# Patient Record
Sex: Female | Born: 1945 | Race: Black or African American | Hispanic: No | Smoking: Never smoker
Health system: Southern US, Community
[De-identification: ages and names within clinical notes are randomized; demographics above are authoritative.]

## PROBLEM LIST (undated history)

## (undated) DIAGNOSIS — M109 Gout, unspecified: Secondary | ICD-10-CM

## (undated) DIAGNOSIS — R1110 Vomiting, unspecified: Secondary | ICD-10-CM

## (undated) DIAGNOSIS — H269 Unspecified cataract: Secondary | ICD-10-CM

## (undated) DIAGNOSIS — M1990 Unspecified osteoarthritis, unspecified site: Secondary | ICD-10-CM

## (undated) HISTORY — DX: Vomiting, unspecified: R11.10

## (undated) HISTORY — DX: Unspecified cataract: H26.9

## (undated) HISTORY — DX: Gout, unspecified: M10.9

## (undated) HISTORY — DX: Unspecified osteoarthritis, unspecified site: M19.90

## (undated) HISTORY — PX: EYE SURGERY: SHX253

---

## 1981-03-05 HISTORY — PX: TUBAL LIGATION: SHX77

## 2008-10-31 ENCOUNTER — Emergency Department (HOSPITAL_COMMUNITY): Admission: EM | Admit: 2008-10-31 | Discharge: 2008-10-31 | Payer: Self-pay | Admitting: Emergency Medicine

## 2010-06-10 LAB — POCT I-STAT, CHEM 8
BUN: 6 mg/dL (ref 6–23)
Chloride: 104 mEq/L (ref 96–112)
Sodium: 139 mEq/L (ref 135–145)
TCO2: 23 mmol/L (ref 0–100)

## 2010-06-10 LAB — DIFFERENTIAL
Basophils Absolute: 0 10*3/uL (ref 0.0–0.1)
Eosinophils Relative: 2 % (ref 0–5)
Lymphocytes Relative: 15 % (ref 12–46)
Neutro Abs: 7.7 10*3/uL (ref 1.7–7.7)

## 2010-06-10 LAB — CBC
Platelets: 214 10*3/uL (ref 150–400)
RDW: 14.3 % (ref 11.5–15.5)

## 2010-07-18 NOTE — Consult Note (Signed)
NAME:  JULIET, VASBINDER NO.:  [id number]   MEDICAL RECORD NO.:  [id number]          PATIENT TYPE:  EMS   LOCATION:  [facility]                         FACILITY:  [facility]   PHYSICIAN:  Georgia Lopes, M.D.  DATE OF BIRTH:  [birth year]   DATE OF CONSULTATION:  10/31/2008  DATE OF DISCHARGE:  10/31/2008                                 CONSULTATION   This is a 65 year old black female who presents to the ER with swelling  and pain of the right upper jaw.  Swelling began approximately 4 days  and progressively worsened.  She denies trismus, dysphagia, visual  changes, and fevers.   She has no known drug allergies.   Past medical history is noncontributory.   She takes no medications.   PHYSICAL EXAMINATION:  GENERAL:  A 65 year old black female, obese with  moderate facial swelling of the right cheek.  HEENT:  Normocephalic, atraumatic.  PERRLA.  EOMI.  Moderate swelling,  right cheek.  No orbital edema, orbital flush, or swelling, #2 buccal,  no trismus.  NECK:  Nontender.   CAT scan demonstrated PA lucency associated with tooth #2.   IMPRESSION:  A 65 year old black female with abscess.   PLAN:  IV antibiotics given in the ER, prescribed Cleocin 300 mg t.i.d.  for 7 days.  Follow up in my office, November 01, 2008, for incision and  drainage and extraction of tooth #2.      Georgia Lopes, M.D.  Electronically Signed     SMJ/MEDQ  D:  10/31/2008  T:  10/31/2008  Job:  [id number]

## 2012-10-17 ENCOUNTER — Encounter (HOSPITAL_COMMUNITY): Payer: Self-pay

## 2012-10-17 ENCOUNTER — Emergency Department (HOSPITAL_COMMUNITY): Payer: Medicare Other

## 2012-10-17 ENCOUNTER — Emergency Department (HOSPITAL_COMMUNITY)
Admission: EM | Admit: 2012-10-17 | Discharge: 2012-10-17 | Disposition: A | Discharge status: Home / Self Care | Payer: Medicare Other | Attending: Emergency Medicine | Admitting: Emergency Medicine

## 2012-10-17 DIAGNOSIS — L539 Erythematous condition, unspecified: Secondary | ICD-10-CM

## 2012-10-17 DIAGNOSIS — L988 Other specified disorders of the skin and subcutaneous tissue: Secondary | ICD-10-CM | POA: Diagnosis not present

## 2012-10-17 DIAGNOSIS — M109 Gout, unspecified: Secondary | ICD-10-CM | POA: Diagnosis not present

## 2012-10-17 DIAGNOSIS — M7989 Other specified soft tissue disorders: Secondary | ICD-10-CM | POA: Diagnosis not present

## 2012-10-17 DIAGNOSIS — R234 Changes in skin texture: Secondary | ICD-10-CM | POA: Insufficient documentation

## 2012-10-17 DIAGNOSIS — M79609 Pain in unspecified limb: Secondary | ICD-10-CM | POA: Diagnosis not present

## 2012-10-17 MED ORDER — DOXYCYCLINE HYCLATE 100 MG PO CAPS: 100.0000 mg | ORAL_CAPSULE | Freq: Two times a day (BID) | ORAL | Status: DC

## 2012-10-17 MED ORDER — HYDROCODONE-ACETAMINOPHEN 5-325 MG PO TABS
1.0000 | ORAL_TABLET | Freq: Once | ORAL | Status: AC
  Administered 2012-10-17: 1 via ORAL
  Filled 2012-10-17: qty 1

## 2012-10-17 MED ORDER — PREDNISONE 50 MG PO TABS: 50.0000 mg | ORAL_TABLET | Freq: Every day | ORAL | Status: DC

## 2012-10-17 MED ORDER — HYDROCODONE-ACETAMINOPHEN 5-325 MG PO TABS: 1.0000 | ORAL_TABLET | Freq: Four times a day (QID) | ORAL | Status: DC | PRN

## 2012-10-17 NOTE — ED Provider Notes (Signed)
Medical screening examination/treatment/procedure(s) were performed by non-physician practitioner and as supervising physician I was immediately available for consultation/collaboration.  Olivia Mackie, MD 10/17/12 [phone]

## 2012-10-17 NOTE — ED Notes (Signed)
Patient presents with c/o right foot pain and swelling x 2 days.  Denies any trauma or injury. Patient states that pain started "all of a sudden" and has progressively gotten worse. Pain woke her up out of sleep tonight. Rates pain 9/10; constant, throbbing and stingy. Pain radiates up leg to the knee. Endorses some N/T to toes. Denies any calf pain. Patient is able to ambulate but with pain. Patient took some Advil around 11 pm with no relief in pain.

## 2012-10-17 NOTE — ED Notes (Signed)
Patient transported to X-ray 

## 2012-10-17 NOTE — ED Notes (Signed)
PA at bedside.

## 2012-10-17 NOTE — ED Provider Notes (Signed)
CSN: [id number]     Arrival date & time 10/17/12  0447 History     First MD Initiated Contact with Patient 10/17/12 [phone]     Chief Complaint  Patient presents with  . Foot Pain   (Consider location/radiation/quality/duration/timing/severity/associated sxs/prior Treatment) HPI Patient presents emergency department with pain and swelling to the top of her right foot.  Patient, states, that pain started 2 days, ago.  Patient, states, that she took ibuprofen without relief of her pain.  Patient, states, that she does not have any numbness, weakness, nausea, vomiting, fever, back pain, or rash.  Patient, states, that she has a history of gout and this feels similar but not quite like her previous episodes. History reviewed. No pertinent past medical history. Past Surgical History  Procedure Laterality Date  . Tubal ligation  1983   History reviewed. No pertinent family history. History  Substance Use Topics  . Smoking status: Never Smoker   . Smokeless tobacco: Never Used  . Alcohol Use: No   OB History   Grav Para Term Preterm Abortions TAB SAB Ect Mult Living   7 7 7       7      Review of Systems All other systems negative except as documented in the HPI. All pertinent positives and negatives as reviewed in the HPI. Allergies  Review of patient's allergies indicates no known allergies.  Home Medications   Current Outpatient Rx  Name  Route  Sig  Dispense  Refill  . ibuprofen (ADVIL,MOTRIN) 200 MG tablet   Oral   Take 600 mg by mouth every 6 (six) hours as needed for pain.          BP 174/80  Pulse 79  Temp(Src) 98 F (36.7 C) (Oral)  Resp 16  SpO2 100% Physical Exam  Nursing note and vitals reviewed. Constitutional: She appears well-developed and well-nourished. No distress.  HENT:  Head: Normocephalic and atraumatic.  Mouth/Throat: Oropharynx is clear and moist.  Cardiovascular: Normal rate, regular rhythm and normal heart sounds.   Pulmonary/Chest: Effort  normal and breath sounds normal.  Musculoskeletal:       Feet:  Skin: Skin is warm and dry.    ED Course   Procedures (including critical care time)  Labs Reviewed - No data to display Dg Foot Complete Right  10/17/2012   *RADIOLOGY REPORT*  Clinical Data: Foot pain.  History of gout.  RIGHT FOOT COMPLETE - 3+ VIEW  Comparison: No priors.  Findings: In the medial and lateral aspect of the distal metatarsal and there are bony erosions adjacent to the joint space.  There may also be a small bony erosion at the medial base of the first metatarsal.  A large bony erosion is noted in the lateral base of the third metatarsal.  Prominence of soft tissues adjacent to the first MTP joint.  Degenerative changes of osteoarthritis are noted, most pronounced at the first MTP joint.  No definite acute displaced fracture, subluxation or dislocation.  IMPRESSION: 1.  Bony and soft tissue changes suggestive of gout, as above. 2.  Negative for acute fracture.   Original Report Authenticated By: Trudie Reed, M.D.   I explained to the patient that this could be gout versus cellulitis, but with her history of gout seems more likely that is the cause.  Patient be treated with medication for an acute gout flare, but also given a prescription of antibiotics if this is not helping in the next 3 days.  MDM  MDM  Reviewed: nursing note and vitals Interpretation: x-ray      Carlyle Dolly, PA-C 10/17/12 0700

## 2012-10-17 NOTE — ED Notes (Signed)
Patient back from x-ray 

## 2012-10-22 ENCOUNTER — Ambulatory Visit: Payer: Medicare Other | Attending: Internal Medicine | Admitting: Internal Medicine

## 2012-10-22 VITALS — BP 130/86 | HR 68 | Temp 98.7°F | Resp 16 | Wt 267.0 lb

## 2012-10-22 DIAGNOSIS — M109 Gout, unspecified: Secondary | ICD-10-CM

## 2012-10-22 LAB — CBC WITH DIFFERENTIAL/PLATELET
Eosinophils Relative: 0 % (ref 0–5)
Lymphocytes Relative: 11 % — ABNORMAL LOW (ref 12–46)
Lymphs Abs: 1.1 10*3/uL (ref 0.7–4.0)
MCV: 83.9 fL (ref 78.0–100.0)
Neutro Abs: 8.6 10*3/uL — ABNORMAL HIGH (ref 1.7–7.7)
Platelets: 264 10*3/uL (ref 150–400)
RBC: 4.67 MIL/uL (ref 3.87–5.11)
WBC: 9.9 10*3/uL (ref 4.0–10.5)

## 2012-10-22 MED ORDER — ALLOPURINOL 300 MG PO TABS: 300.0000 mg | ORAL_TABLET | Freq: Every day | ORAL | Status: DC

## 2012-10-22 MED ORDER — COLCHICINE 0.6 MG PO TABS: 0.6000 mg | ORAL_TABLET | Freq: Every day | ORAL | Status: DC

## 2012-10-22 NOTE — Patient Instructions (Signed)
Patient instructed to call tomorrow to obtain results of her labs

## 2012-10-22 NOTE — Progress Notes (Signed)
Patient ID: Tara Hernandez, female   DOB: 03-Mar-1946, 67 y.o.   MRN: [id number]    CC:  HPI: 67 year old female is here for followup of her ED visit on 8/15. The patient was in the ED for right foot swelling. She was prescribed prednisone and Vicodin for acute gout flare. The patient's symptoms have resolved and she feels 100% better.   No Known Allergies History reviewed. No pertinent past medical history. Current Outpatient Prescriptions on File Prior to Visit  Medication Sig Dispense Refill  . doxycycline (VIBRAMYCIN) 100 MG capsule Take 1 capsule (100 mg total) by mouth 2 (two) times daily.  20 capsule  0  . HYDROcodone-acetaminophen (NORCO/VICODIN) 5-325 MG per tablet Take 1 tablet by mouth every 6 (six) hours as needed for pain.  15 tablet  0   No current facility-administered medications on file prior to visit.   History reviewed. No pertinent family history. History   Social History  . Marital Status: Divorced    Spouse Name: N/A    Number of Children: N/A  . Years of Education: N/A   Occupational History  . Not on file.   Social History Main Topics  . Smoking status: Never Smoker   . Smokeless tobacco: Never Used  . Alcohol Use: No  . Drug Use: No  . Sexual Activity: Not Currently   Other Topics Concern  . Not on file   Social History Narrative  . No narrative on file    Review of Systems  Constitutional: Negative for fever, chills, diaphoresis, activity change, appetite change and fatigue.  HENT: Negative for ear pain, nosebleeds, congestion, facial swelling, rhinorrhea, neck pain, neck stiffness and ear discharge.   Eyes: Negative for pain, discharge, redness, itching and visual disturbance.  Respiratory: Negative for cough, choking, chest tightness, shortness of breath, wheezing and stridor.   Cardiovascular: Negative for chest pain, palpitations and leg swelling.  Gastrointestinal: Negative for abdominal distention.  Genitourinary: Negative for dysuria,  urgency, frequency, hematuria, flank pain, decreased urine volume, difficulty urinating and dyspareunia.  Musculoskeletal: Negative for back pain, joint swelling, arthralgias and gait problem.  Neurological: Negative for dizziness, tremors, seizures, syncope, facial asymmetry, speech difficulty, weakness, light-headedness, numbness and headaches.  Hematological: Negative for adenopathy. Does not bruise/bleed easily.  Psychiatric/Behavioral: Negative for hallucinations, behavioral problems, confusion, dysphoric mood, decreased concentration and agitation.    Objective:   Filed Vitals:   10/22/12 1651  BP: 130/86  Pulse: 68  Temp: 98.7 F (37.1 C)  Resp: 16    Physical Exam  Constitutional: Appears well-developed and well-nourished. No distress.  HENT: Normocephalic. External right and left ear normal. Oropharynx is clear and moist.  Eyes: Conjunctivae and EOM are normal. PERRLA, no scleral icterus.  Neck: Normal ROM. Neck supple. No JVD. No tracheal deviation. No thyromegaly.  CVS: RRR, S1/S2 +, no murmurs, no gallops, no carotid bruit.  Pulmonary: Effort and breath sounds normal, no stridor, rhonchi, wheezes, rales.  Abdominal: Soft. BS +,  no distension, tenderness, rebound or guarding.  Musculoskeletal: Normal range of motion. No edema and no tenderness.  Lymphadenopathy: No lymphadenopathy noted, cervical, inguinal. Neuro: Alert. Normal reflexes, muscle tone coordination. No cranial nerve deficit. Skin: Skin is warm and dry. No rash noted. Not diaphoretic. No erythema. No pallor.  Psychiatric: Normal mood and affect. Behavior, judgment, thought content normal.   Lab Results  Component Value Date   WBC 9.9 10/31/2008   HGB 12.9 10/31/2008   HCT 38.0 10/31/2008   MCV 87.1 10/31/2008  PLT 214 10/31/2008   Lab Results  Component Value Date   CREATININE 0.9 10/31/2008   BUN 6 10/31/2008   NA 139 10/31/2008   K 3.3* 10/31/2008   CL 104 10/31/2008    No results found for this  basename: HGBA1C   Lipid Panel  No results found for this basename: chol, trig, hdl, cholhdl, vldl, ldlcalc       Assessment and plan:   There are no active problems to display for this patient.      Acute gout flare We'll check uric acid level Will obtain BE met to establish her kidney function She has been prescribed allopurinol She is advised to call back tomorrow, to get the results of her blood work. If her kidney function is within the normal range and the patient can take colchicine as needed for acute gout She will need followup every 2 months I have discontinued her ibuprofen

## 2012-10-22 NOTE — Progress Notes (Signed)
Hospital follow up Recent flare up of her gout

## 2012-10-23 ENCOUNTER — Telehealth: Payer: Self-pay | Admitting: Internal Medicine

## 2012-10-23 LAB — LIPID PANEL
Cholesterol: 221 mg/dL — ABNORMAL HIGH (ref 0–200)
HDL: 65 mg/dL (ref >39)
Total CHOL/HDL Ratio: 3.4 Ratio
Triglycerides: 155 mg/dL — ABNORMAL HIGH (ref <150)
VLDL: 31 mg/dL (ref 0–40)

## 2012-10-23 LAB — COMPREHENSIVE METABOLIC PANEL
CO2: 30 mEq/L (ref 19–32)
Chloride: 100 mEq/L (ref 96–112)
Potassium: 4.2 mEq/L (ref 3.5–5.3)
Sodium: 139 mEq/L (ref 135–145)

## 2012-10-23 NOTE — Telephone Encounter (Signed)
Pt calling about lab results, please f/u with pt.

## 2012-10-28 ENCOUNTER — Telehealth: Payer: Self-pay | Admitting: Internal Medicine

## 2012-10-28 ENCOUNTER — Other Ambulatory Visit: Payer: Self-pay | Admitting: Internal Medicine

## 2012-10-28 MED ORDER — ERGOCALCIFEROL 1.25 MG (50000 UT) PO CAPS: 50000.0000 [IU] | ORAL_CAPSULE | ORAL | Status: DC

## 2012-10-28 NOTE — Telephone Encounter (Signed)
Pt has been experiencing some side effects from script allopurinol (ZYLOPRIM) 300 MG tablet; pt says muscle aching, swelling in knees, depression and dry mouth; pt discontinued medication on her own; pt would like a call back for advice on what to do next and results of bloodwork; pt number is [phone]

## 2012-10-28 NOTE — Telephone Encounter (Signed)
Spoke with patient about her reaction Per dr Hyman Hopes those symptoms would not be from allpurinal Patient states she is feeling better since she stopped taking the medicine

## 2012-10-28 NOTE — Telephone Encounter (Signed)
Informed patient that her uric acid was  Elevated and vitamin D low Dr Susie Cassette escribed  Prescription for vita D to her pharmacy and she already had a script For allpurinal

## 2012-11-24 ENCOUNTER — Ambulatory Visit: Payer: Medicare Other | Attending: Internal Medicine | Admitting: Internal Medicine

## 2012-11-24 ENCOUNTER — Encounter: Payer: Self-pay | Admitting: Internal Medicine

## 2012-11-24 VITALS — BP 171/94 | HR 78 | Temp 98.0°F | Resp 16 | Wt 265.0 lb

## 2012-11-24 DIAGNOSIS — I10 Essential (primary) hypertension: Secondary | ICD-10-CM | POA: Diagnosis not present

## 2012-11-24 DIAGNOSIS — E785 Hyperlipidemia, unspecified: Secondary | ICD-10-CM | POA: Diagnosis not present

## 2012-11-24 DIAGNOSIS — Z09 Encounter for follow-up examination after completed treatment for conditions other than malignant neoplasm: Secondary | ICD-10-CM | POA: Insufficient documentation

## 2012-11-24 MED ORDER — COLCHICINE 0.6 MG PO TABS: 0.6000 mg | ORAL_TABLET | Freq: Every day | ORAL | Status: DC

## 2012-11-24 NOTE — Patient Instructions (Signed)
How to Take Your Blood Pressure  These instructions are only for electronic home blood pressure machines. You will need:   An automatic or semi-automatic blood pressure machine.  Fresh batteries for the blood pressure machine. HOW DO I USE THESE TOOLS TO CHECK MY BLOOD PRESSURE?   There are 2 numbers that make up your blood pressure. For example: 120/80.  The first number (120 in our example) is called the "systolic pressure." It is a measure of the pressure in your blood vessels when your heart is pumping blood.  The second number (80 in our example) is called the "diastolic pressure." It is a measure of the pressure in your blood vessels when your heart is resting between beats.  Before you buy a home blood pressure machine, check the size of your arm so you can buy the right size cuff. Here is how to check the size of your arm:  Use a tape measure that shows both inches and centimeters.  Wrap the tape measure around the middle upper part of your arm. You may need someone to help you measure right.  Write down your arm measurement in both inches and centimeters.  To measure your blood pressure right, it is important to have the right size cuff.  If your arm is up to 13 inches (37 to 34 centimeters), get an adult cuff size.  If your arm is 13 to 17 inches (35 to 44 centimeters), get a large adult cuff size.  If your arm is 17 to 20 inches (45 to 52 centimeters), get an adult thigh cuff.  Try to rest or relax for at least 30 minutes before you check your blood pressure.  Do not smoke.  Do not have any drinks with caffeine, such as:  Pop.  Coffee.  Tea.  Check your blood pressure in a quiet room.  Sit down and stretch out your arm on a table. Keep your arm at about the level of your heart. Let your arm relax. GETTING BLOOD PRESSURE READINGS  Make sure you remove any tight-fighting clothing from your arm. Wrap the cuff around your upper arm. Wrap it just above the bend,  and above where you felt the pulse. You should be able to slip a finger between the cuff and your arm. If you cannot slip a finger in the cuff, it is too tight and should be removed and rewrapped.  Some units requires you to manually pump up the arm cuff.  Automatic units inflate the cuff when you press a button.  Cuff deflation is automatic in both models.  After the cuff is inflated, the unit measures your blood pressure and pulse. The readings are displayed on a monitor. Hold still and breathe normally while the cuff is inflated.  Getting a reading takes less than a minute.  Some models store readings in a memory. Some provide a printout of readings.  Get readings at different times of the day. You should wait at least 5 minutes between readings. Take readings with you to your next doctor's visit. Document Released: 02/02/2008 Document Revised: 05/14/2011 Document Reviewed: 02/02/2008 [employer] Patient Information 2014 [employer].  

## 2012-11-24 NOTE — Progress Notes (Signed)
Patient ID: Tara Hernandez, female   DOB: 1945-06-01, 67 y.o.   MRN: [id number]   CC: Followup  HPI: Patient is 67 year old pleasant female who presents to clinic for followup. She reports doing well, denies chest pain or shortness of breath, no significant medical problems, exercises regularly and checks blood pressure regularly.  No Known Allergies Past Medical History  Diagnosis Date  . Gout    Current Outpatient Prescriptions on File Prior to Visit  Medication Sig Dispense Refill  . allopurinol (ZYLOPRIM) 300 MG tablet Take 1 tablet (300 mg total) by mouth daily.  60 tablet  6  . doxycycline (VIBRAMYCIN) 100 MG capsule Take 1 capsule (100 mg total) by mouth 2 (two) times daily.  20 capsule  0  . ergocalciferol (VITAMIN D2) 50000 UNITS capsule Take 1 capsule (50,000 Units total) by mouth once a week.  10 capsule  12  . HYDROcodone-acetaminophen (NORCO/VICODIN) 5-325 MG per tablet Take 1 tablet by mouth every 6 (six) hours as needed for pain.  15 tablet  0   No current facility-administered medications on file prior to visit.   No known family medical history History   Social History  . Marital Status: Divorced    Spouse Name: N/A    Number of Children: N/A  . Years of Education: N/A   Occupational History  . Not on file.   Social History Main Topics  . Smoking status: Never Smoker   . Smokeless tobacco: Never Used  . Alcohol Use: No  . Drug Use: No  . Sexual Activity: Not Currently   Other Topics Concern  . Not on file   Social History Narrative  . No narrative on file    Review of Systems  Constitutional: Negative for fever, chills, diaphoresis, activity change, appetite change and fatigue.  HENT: Negative for ear pain, nosebleeds, congestion, facial swelling, rhinorrhea, neck pain, neck stiffness and ear discharge.   Eyes: Negative for pain, discharge, redness, itching and visual disturbance.  Respiratory: Negative for cough, choking, chest tightness, shortness of  breath, wheezing and stridor.   Cardiovascular: Negative for chest pain, palpitations and leg swelling.  Gastrointestinal: Negative for abdominal distention.  Genitourinary: Negative for dysuria, urgency, frequency, hematuria, flank pain, decreased urine volume, difficulty urinating and dyspareunia.  Musculoskeletal: Negative for back pain, joint swelling, arthralgias and gait problem.  Neurological: Negative for dizziness, tremors, seizures, syncope, facial asymmetry, speech difficulty, weakness, light-headedness, numbness and headaches.  Hematological: Negative for adenopathy. Does not bruise/bleed easily.  Psychiatric/Behavioral: Negative for hallucinations, behavioral problems, confusion, dysphoric mood, decreased concentration and agitation.    Objective:   Filed Vitals:   11/24/12 1247  BP: 171/94  Pulse: 78  Temp: 98 F (36.7 C)  Resp: 16    Physical Exam  Constitutional: Appears well-developed and well-nourished. No distress.  CVS: RRR, S1/S2 +, no murmurs, no gallops, no carotid bruit.  Pulmonary: Effort and breath sounds normal, no stridor, rhonchi, wheezes, rales.  Abdominal: Soft. BS +,  no distension, tenderness, rebound or guarding.    Lab Results  Component Value Date   WBC 9.9 10/22/2012   HGB 13.3 10/22/2012   HCT 39.2 10/22/2012   MCV 83.9 10/22/2012   PLT 264 10/22/2012   Lab Results  Component Value Date   CREATININE 0.83 10/22/2012   BUN 13 10/22/2012   NA 139 10/22/2012   K 4.2 10/22/2012   CL 100 10/22/2012   CO2 30 10/22/2012    No results found for this basename: HGBA1C  Lipid Panel     Component Value Date/Time   CHOL 221* 10/22/2012 1732   TRIG 155* 10/22/2012 1732   HDL 65 10/22/2012 1732   CHOLHDL 3.4 10/22/2012 1732   VLDL 31 10/22/2012 1732   LDLCALC 125* 10/22/2012 1732       Assessment and plan:   Hypertension - we have discussed target blood pressure range and I have advised patient to check blood pressure regularly and to call us back  if the numbers are persistently higher than 140/90, patient does not want to start blood pressure medicine at this time and I have encouraged her to monitor blood pressure closely for the weekend provide appropriate management.  Hyperlipidemia - we have discussed results of the cholesterol panel and target LDL and cholesterol. Patient verbalized understanding, she will remain compliant with her exercise and dietary restrictions. We'll check cholesterol in 6 months and will decide on the management at that point.

## 2012-11-24 NOTE — Progress Notes (Signed)
Pt is here for a f/u and to review meds Reports she is having s/e to allopurinol and has stopped it She is alert w/no signs of acute distress.

## 2012-11-25 ENCOUNTER — Ambulatory Visit: Payer: Medicaid Other

## 2013-08-13 DIAGNOSIS — H40039 Anatomical narrow angle, unspecified eye: Secondary | ICD-10-CM | POA: Diagnosis not present

## 2013-08-13 DIAGNOSIS — H2510 Age-related nuclear cataract, unspecified eye: Secondary | ICD-10-CM | POA: Diagnosis not present

## 2013-08-31 DIAGNOSIS — H2589 Other age-related cataract: Secondary | ICD-10-CM | POA: Diagnosis not present

## 2013-09-07 DIAGNOSIS — Z961 Presence of intraocular lens: Secondary | ICD-10-CM | POA: Diagnosis not present

## 2013-09-07 DIAGNOSIS — H269 Unspecified cataract: Secondary | ICD-10-CM | POA: Diagnosis not present

## 2013-09-07 DIAGNOSIS — H2589 Other age-related cataract: Secondary | ICD-10-CM | POA: Diagnosis not present

## 2013-09-07 DIAGNOSIS — H2510 Age-related nuclear cataract, unspecified eye: Secondary | ICD-10-CM | POA: Diagnosis not present

## 2013-10-19 ENCOUNTER — Encounter: Payer: Medicare Other | Admitting: Internal Medicine

## 2013-10-19 DIAGNOSIS — Z961 Presence of intraocular lens: Secondary | ICD-10-CM | POA: Diagnosis not present

## 2013-10-19 DIAGNOSIS — H269 Unspecified cataract: Secondary | ICD-10-CM | POA: Diagnosis not present

## 2013-10-19 DIAGNOSIS — H2510 Age-related nuclear cataract, unspecified eye: Secondary | ICD-10-CM | POA: Diagnosis not present

## 2013-10-19 DIAGNOSIS — H2589 Other age-related cataract: Secondary | ICD-10-CM | POA: Diagnosis not present

## 2013-11-14 DIAGNOSIS — H1045 Other chronic allergic conjunctivitis: Secondary | ICD-10-CM | POA: Diagnosis not present

## 2014-01-04 ENCOUNTER — Encounter: Payer: Self-pay | Admitting: Internal Medicine

## 2014-03-08 ENCOUNTER — Ambulatory Visit (INDEPENDENT_AMBULATORY_CARE_PROVIDER_SITE_OTHER): Payer: Medicare Other | Admitting: Emergency Medicine

## 2014-03-08 ENCOUNTER — Ambulatory Visit (INDEPENDENT_AMBULATORY_CARE_PROVIDER_SITE_OTHER): Payer: Medicare Other

## 2014-03-08 VITALS — BP 126/82 | HR 89 | Temp 98.0°F | Resp 16 | Ht 64.5 in | Wt 250.0 lb

## 2014-03-08 DIAGNOSIS — M25561 Pain in right knee: Secondary | ICD-10-CM

## 2014-03-08 DIAGNOSIS — M1711 Unilateral primary osteoarthritis, right knee: Secondary | ICD-10-CM

## 2014-03-08 MED ORDER — DICLOFENAC SODIUM 1 % TD GEL: TRANSDERMAL | Status: DC

## 2014-03-08 NOTE — Progress Notes (Signed)
Subjective:  This chart was scribed for Tara Jordan, MD by Tara Hernandez, ED Scribe at [facility].The patient was seen in exam room 05 and the patient's care was started at 1:15 PM.   Patient ID: Tara Hernandez, female    DOB: 11-22-1945, 69 y.o.   MRN: [id number] Chief Complaint  Patient presents with   Knee Pain    right knee pain; on/off over 1 year; pain radiates up the thigh and pain; sometimes can not sleep   HPI HPI Comments: Tara Hernandez is a 69 y.o. female who presents to [facility] complaining of intermittent right knee pain, ongoing for 1 year. Her knee "pops a lot" but has never gotten stuck. No recent flare ups. She has trouble sleeping due to her knee pain. Pt is only taking OTC medication for relief.  Pt reports falling years ago and she believes this is the source. Pt has trouble with exercising but she does not believe she injured her knee due to exercise recently. Pt is retired, she was a Probation officer.  There are no active problems to display for this patient.  Past Medical History  Diagnosis Date   Gout    Past Surgical History  Procedure Laterality Date   Tubal ligation  1983   Eye surgery     No Known Allergies Prior to Admission medications   Not on File   History   Social History   Marital Status: Divorced    Spouse Name: N/A    Number of Children: N/A   Years of Education: N/A   Occupational History   Not on file.   Social History Main Topics   Smoking status: Never Smoker    Smokeless tobacco: Never Used   Alcohol Use: No   Drug Use: No   Sexual Activity: Not Currently   Other Topics Concern   Not on file   Social History Narrative   Review of Systems  Musculoskeletal: Positive for arthralgias.  Psychiatric/Behavioral: Positive for sleep disturbance.       Objective:  BP 126/82 mmHg   Pulse 89   Temp(Src) 98 F (36.7 C) (Oral)   Resp 16   Ht 5' 4.5" (1.638 m)   Wt 250 lb (113.399 kg)   BMI 42.27 kg/m2    SpO2 98%  Physical Exam  Constitutional: She is oriented to person, place, and time. She appears well-developed and well-nourished. No distress.  HENT:  Head: Normocephalic and atraumatic.  Eyes: EOM are normal.  Neck: Normal range of motion.  Cardiovascular: Normal rate.   Pulmonary/Chest: Effort normal.  Musculoskeletal:  Degenerative changes around the right knee Pain with flexion and extension. No joint instability, or redness and increased warmth.  Neurological: She is alert and oriented to person, place, and time. No cranial nerve deficit. She exhibits normal muscle tone. Coordination normal.  Skin: Skin is warm and dry.  Psychiatric: She has a normal mood and affect. Her behavior is normal.  Nursing note and vitals reviewed. [facility] reading (PRIMARY) by  Dr. Everlene Farrier patient has arthritic changes around the kneecap with significant narrowing laterally no fracture seen.       Assessment & Plan:   Patient has significant osteoarthritis of the right knee. Referral made to orthopedics to help with this. She is placed on Voltaren gel to apply 3 times a day.I personally performed the services described in this documentation, which was scribed in my presence. The recorded information has been reviewed and is accurate.

## 2014-03-08 NOTE — Progress Notes (Signed)
Subjective:  This chart was scribed for Nena Jordan, MD by Dellis Filbert, ED Scribe at [facility].The patient was seen in exam room 05 and the patient's care was started at 1:15 PM.   Patient ID: Tara Hernandez, female    DOB: 1945/04/03, 69 y.o.   MRN: [id number] Chief Complaint  Patient presents with  . Knee Pain    right knee pain; on/off over 1 year; pain radiates up the thigh and pain; sometimes can not sleep   Knee Pain    HPI Comments: Tara Hernandez is a 69 y.o. female who presents to [facility] complaining of intermittent right knee pain, ongoing for 1 year. Her knee "pops a lot" but has never gotten stuck. No recent flare ups. She has trouble sleeping due to her knee pain. Pt is only taking OTC medication for relief.  Pt reports falling years ago and she believes this is the source. Pt has trouble with exercising but she does not believe she injured her knee due to exercise recently. Pt is retired, she was a Probation officer.  There are no active problems to display for this patient.  Past Medical History  Diagnosis Date  . Gout    Past Surgical History  Procedure Laterality Date  . Tubal ligation  1983  . Eye surgery     No Known Allergies Prior to Admission medications   Not on File   History   Social History  . Marital Status: Divorced    Spouse Name: N/A    Number of Children: N/A  . Years of Education: N/A   Occupational History  . Not on file.   Social History Main Topics  . Smoking status: Never Smoker   . Smokeless tobacco: Never Used  . Alcohol Use: No  . Drug Use: No  . Sexual Activity: Not Currently   Other Topics Concern  . Not on file   Social History Narrative   Review of Systems  Musculoskeletal: Positive for arthralgias.  Psychiatric/Behavioral: Positive for sleep disturbance.       Objective:  BP 126/82 mmHg  Pulse 89  Temp(Src) 98 F (36.7 C) (Oral)  Resp 16  Ht 5' 4.5" (1.638 m)  Wt 250 lb (113.399 kg)  BMI  42.27 kg/m2  SpO2 98%  Physical Exam  Constitutional: She is oriented to person, place, and time. She appears well-developed and well-nourished. No distress.  HENT:  Head: Normocephalic and atraumatic.  Eyes: EOM are normal.  Neck: Normal range of motion.  Cardiovascular: Normal rate.   Pulmonary/Chest: Effort normal.  Musculoskeletal:  Degenerative changes around the right knee Pain with flexion and extension. No joint instability, or redness and increased warmth.  Neurological: She is alert and oriented to person, place, and time. No cranial nerve deficit. She exhibits normal muscle tone. Coordination normal.  Skin: Skin is warm and dry.  Psychiatric: She has a normal mood and affect. Her behavior is normal.  Nursing note and vitals reviewed. [facility] reading (PRIMARY) by  Dr. Everlene Farrier patient has arthritic changes around the kneecap with significant narrowing laterally no fracture seen.       Assessment & Plan:   Patient has significant osteoarthritis of the right knee. Referral made to orthopedics to help with this. She is placed on Voltaren gel to apply 3 times a day.I personally performed the services described in this documentation, which was scribed in my presence. The recorded information has been reviewed and is accurate.

## 2014-03-08 NOTE — Patient Instructions (Signed)

## 2014-03-25 ENCOUNTER — Encounter: Payer: Self-pay | Admitting: Internal Medicine

## 2014-03-25 ENCOUNTER — Ambulatory Visit: Payer: Medicare Other | Attending: Internal Medicine | Admitting: Internal Medicine

## 2014-03-25 VITALS — BP 151/83 | HR 87 | Temp 98.0°F | Resp 16 | Wt 251.0 lb

## 2014-03-25 DIAGNOSIS — M179 Osteoarthritis of knee, unspecified: Secondary | ICD-10-CM | POA: Insufficient documentation

## 2014-03-25 DIAGNOSIS — M1711 Unilateral primary osteoarthritis, right knee: Secondary | ICD-10-CM

## 2014-03-25 DIAGNOSIS — I10 Essential (primary) hypertension: Secondary | ICD-10-CM | POA: Insufficient documentation

## 2014-03-25 DIAGNOSIS — M109 Gout, unspecified: Secondary | ICD-10-CM | POA: Insufficient documentation

## 2014-03-25 DIAGNOSIS — Z1231 Encounter for screening mammogram for malignant neoplasm of breast: Secondary | ICD-10-CM | POA: Diagnosis not present

## 2014-03-25 DIAGNOSIS — E559 Vitamin D deficiency, unspecified: Secondary | ICD-10-CM | POA: Diagnosis not present

## 2014-03-25 DIAGNOSIS — R7309 Other abnormal glucose: Secondary | ICD-10-CM | POA: Insufficient documentation

## 2014-03-25 DIAGNOSIS — R7303 Prediabetes: Secondary | ICD-10-CM | POA: Insufficient documentation

## 2014-03-25 LAB — COMPLETE METABOLIC PANEL WITH GFR
ALBUMIN: 4.1 g/dL (ref 3.5–5.2)
ALT: 17 U/L (ref 0–35)
AST: 22 U/L (ref 0–37)
Alkaline Phosphatase: 68 U/L (ref 39–117)
BUN: 14 mg/dL (ref 6–23)
CALCIUM: 9.2 mg/dL (ref 8.4–10.5)
CO2: 25 mEq/L (ref 19–32)
Chloride: 104 mEq/L (ref 96–112)
Creat: 0.7 mg/dL (ref 0.50–1.10)
GFR, Est Non African American: 89 mL/min
Glucose, Bld: 88 mg/dL (ref 70–99)
Potassium: 4.7 mEq/L (ref 3.5–5.3)
SODIUM: 139 meq/L (ref 135–145)
Total Bilirubin: 0.7 mg/dL (ref 0.2–1.2)
Total Protein: 7.2 g/dL (ref 6.0–8.3)

## 2014-03-25 LAB — LIPID PANEL
Cholesterol: 198 mg/dL (ref 0–200)
HDL: 55 mg/dL (ref >39)
LDL Cholesterol: 128 mg/dL — ABNORMAL HIGH (ref 0–99)
Total CHOL/HDL Ratio: 3.6 Ratio
Triglycerides: 77 mg/dL (ref <150)
VLDL: 15 mg/dL (ref 0–40)

## 2014-03-25 LAB — POCT GLYCOSYLATED HEMOGLOBIN (HGB A1C): Hemoglobin A1C: 5.3

## 2014-03-25 MED ORDER — ACETAMINOPHEN-CODEINE #3 300-30 MG PO TABS: 1.0000 | ORAL_TABLET | Freq: Three times a day (TID) | ORAL | Status: DC | PRN

## 2014-03-25 NOTE — Patient Instructions (Signed)
DASH Eating Plan DASH stands for "Dietary Approaches to Stop Hypertension." The DASH eating plan is a healthy eating plan that has been shown to reduce high blood pressure (hypertension). Additional health benefits may include reducing the risk of type 2 diabetes mellitus, heart disease, and stroke. The DASH eating plan may also help with weight loss. WHAT DO I NEED TO KNOW ABOUT THE DASH EATING PLAN? For the DASH eating plan, you will follow these general guidelines:  Choose foods with a percent daily value for sodium of less than 5% (as listed on the food label).  Use salt-free seasonings or herbs instead of table salt or sea salt.  Check with your health care provider or pharmacist before using salt substitutes.  Eat lower-sodium products, often labeled as "lower sodium" or "no salt added."  Eat fresh foods.  Eat more vegetables, fruits, and low-fat dairy products.  Choose whole grains. Look for the word "whole" as the first word in the ingredient list.  Choose fish and skinless chicken or turkey more often than red meat. Limit fish, poultry, and meat to 6 oz (170 g) each day.  Limit sweets, desserts, sugars, and sugary drinks.  Choose heart-healthy fats.  Limit cheese to 1 oz (28 g) per day.  Eat more home-cooked food and less restaurant, buffet, and fast food.  Limit fried foods.  Cook foods using methods other than frying.  Limit canned vegetables. If you do use them, rinse them well to decrease the sodium.  When eating at a restaurant, ask that your food be prepared with less salt, or no salt if possible. WHAT FOODS CAN I EAT? Seek help from a dietitian for individual calorie needs. Grains Whole grain or whole wheat bread. Brown rice. Whole grain or whole wheat pasta. Quinoa, bulgur, and whole grain cereals. Low-sodium cereals. Corn or whole wheat flour tortillas. Whole grain cornbread. Whole grain crackers. Low-sodium crackers. Vegetables Fresh or frozen vegetables  (raw, steamed, roasted, or grilled). Low-sodium or reduced-sodium tomato and vegetable juices. Low-sodium or reduced-sodium tomato sauce and paste. Low-sodium or reduced-sodium canned vegetables.  Fruits All fresh, canned (in natural juice), or frozen fruits. Meat and Other Protein Products Ground beef (85% or leaner), grass-fed beef, or beef trimmed of fat. Skinless chicken or turkey. Ground chicken or turkey. Pork trimmed of fat. All fish and seafood. Eggs. Dried beans, peas, or lentils. Unsalted nuts and seeds. Unsalted canned beans. Dairy Low-fat dairy products, such as skim or 1% milk, 2% or reduced-fat cheeses, low-fat ricotta or cottage cheese, or plain low-fat yogurt. Low-sodium or reduced-sodium cheeses. Fats and Oils Tub margarines without trans fats. Light or reduced-fat mayonnaise and salad dressings (reduced sodium). Avocado. Safflower, olive, or canola oils. Natural peanut or almond butter. Other Unsalted popcorn and pretzels. The items listed above may not be a complete list of recommended foods or beverages. Contact your dietitian for more options. WHAT FOODS ARE NOT RECOMMENDED? Grains White bread. White pasta. White rice. Refined cornbread. Bagels and croissants. Crackers that contain trans fat. Vegetables Creamed or fried vegetables. Vegetables in a cheese sauce. Regular canned vegetables. Regular canned tomato sauce and paste. Regular tomato and vegetable juices. Fruits Dried fruits. Canned fruit in light or heavy syrup. Fruit juice. Meat and Other Protein Products Fatty cuts of meat. Ribs, chicken wings, bacon, sausage, bologna, salami, chitterlings, fatback, hot dogs, bratwurst, and packaged luncheon meats. Salted nuts and seeds. Canned beans with salt. Dairy Whole or 2% milk, cream, half-and-half, and cream cheese. Whole-fat or sweetened yogurt. Full-fat   cheeses or blue cheese. Nondairy creamers and whipped toppings. Processed cheese, cheese spreads, or cheese  curds. Condiments Onion and garlic salt, seasoned salt, table salt, and sea salt. Canned and packaged gravies. Worcestershire sauce. Tartar sauce. Barbecue sauce. Teriyaki sauce. Soy sauce, including reduced sodium. Steak sauce. Fish sauce. Oyster sauce. Cocktail sauce. Horseradish. Ketchup and mustard. Meat flavorings and tenderizers. Bouillon cubes. Hot sauce. Tabasco sauce. Marinades. Taco seasonings. Relishes. Fats and Oils Butter, stick margarine, lard, shortening, ghee, and bacon fat. Coconut, palm kernel, or palm oils. Regular salad dressings. Other Pickles and olives. Salted popcorn and pretzels. The items listed above may not be a complete list of foods and beverages to avoid. Contact your dietitian for more information. WHERE CAN I FIND MORE INFORMATION? National Heart, Lung, and Blood Institute: www.nhlbi.nih.gov/health/health-topics/topics/dash/ Document Released: 02/08/2011 Document Revised: 07/06/2013 Document Reviewed: 12/24/2012 ExitCare Patient Information 2015 [employer]. This information is not intended to replace advice given to you by your health care provider. Make sure you discuss any questions you have with your health care provider. Hypertension Hypertension, commonly called high blood pressure, is when the force of blood pumping through your arteries is too strong. Your arteries are the blood vessels that carry blood from your heart throughout your body. A blood pressure reading consists of a higher number over a lower number, such as 110/72. The higher number (systolic) is the pressure inside your arteries when your heart pumps. The lower number (diastolic) is the pressure inside your arteries when your heart relaxes. Ideally you want your blood pressure below 120/80. Hypertension forces your heart to work harder to pump blood. Your arteries may become narrow or stiff. Having hypertension puts you at risk for heart disease, stroke, and other problems.  RISK  FACTORS Some risk factors for high blood pressure are controllable. Others are not.  Risk factors you cannot control include:   Race. You may be at higher risk if you are African American.  Age. Risk increases with age.  Gender. Men are at higher risk than women before age 45 years. After age 65, women are at higher risk than men. Risk factors you can control include:  Not getting enough exercise or physical activity.  Being overweight.  Getting too much fat, sugar, calories, or salt in your diet.  Drinking too much alcohol. SIGNS AND SYMPTOMS Hypertension does not usually cause signs or symptoms. Extremely high blood pressure (hypertensive crisis) may cause headache, anxiety, shortness of breath, and nosebleed. DIAGNOSIS  To check if you have hypertension, your health care provider will measure your blood pressure while you are seated, with your arm held at the level of your heart. It should be measured at least twice using the same arm. Certain conditions can cause a difference in blood pressure between your right and left arms. A blood pressure reading that is higher than normal on one occasion does not mean that you need treatment. If one blood pressure reading is high, ask your health care provider about having it checked again. TREATMENT  Treating high blood pressure includes making lifestyle changes and possibly taking medicine. Living a healthy lifestyle can help lower high blood pressure. You may need to change some of your habits. Lifestyle changes may include:  Following the DASH diet. This diet is high in fruits, vegetables, and whole grains. It is low in salt, red meat, and added sugars.  Getting at least 2 hours of brisk physical activity every week.  Losing weight if necessary.  Not smoking.  Limiting   alcoholic beverages.  Learning ways to reduce stress. If lifestyle changes are not enough to get your blood pressure under control, your health care provider may  prescribe medicine. You may need to take more than one. Work closely with your health care provider to understand the risks and benefits. HOME CARE INSTRUCTIONS  Have your blood pressure rechecked as directed by your health care provider.   Take medicines only as directed by your health care provider. Follow the directions carefully. Blood pressure medicines must be taken as prescribed. The medicine does not work as well when you skip doses. Skipping doses also puts you at risk for problems.   Do not smoke.   Monitor your blood pressure at home as directed by your health care provider. SEEK MEDICAL CARE IF:   You think you are having a reaction to medicines taken.  You have recurrent headaches or feel dizzy.  You have swelling in your ankles.  You have trouble with your vision. SEEK IMMEDIATE MEDICAL CARE IF:  You develop a severe headache or confusion.  You have unusual weakness, numbness, or feel faint.  You have severe chest or abdominal pain.  You vomit repeatedly.  You have trouble breathing. MAKE SURE YOU:   Understand these instructions.  Will watch your condition.  Will get help right away if you are not doing well or get worse. Document Released: 02/19/2005 Document Revised: 07/06/2013 Document Reviewed: 12/12/2012 ExitCare Patient Information 2015 ExitCare, LLC. This information is not intended to replace advice given to you by your health care provider. Make sure you discuss any questions you have with your health care provider.  

## 2014-03-25 NOTE — Progress Notes (Signed)
Patient states she is here for her annual physical Currently not taking any prescribed medications Patient has declined the flu vaccine as well

## 2014-03-25 NOTE — Progress Notes (Signed)
Patient ID: Tara Hernandez, female   DOB: 1945/08/14, 69 y.o.   MRN: [id number]   Tara Hernandez, is a 69 y.o. female  [id number]  [id number]  DOB - [birth year]  Chief Complaint  Patient presents with  . Annual Exam        Subjective:   Tara Hernandez is a 69 y.o. female here today for a follow up visit. She is a pleasant woman with a past history of right knee osteoarthritis, recent eye surgery for cataracts, currently not on any medication, here today for her annual physical examination. She had a history of hypovitaminosis D that was replaced, she would like to know what her level is as of today. She has not had colonoscopy, no mammogram in the recent past. She has no complaint today. Patient has No headache, No chest pain, No abdominal pain - No Nausea, No new weakness tingling or numbness, No Cough - SOB.  Problem  Vitamin D Deficiency  Encounter for Screening Mammogram for Breast Cancer  Prediabetes  Essential Hypertension  Primary Osteoarthritis of Right Knee    ALLERGIES: No Known Allergies  PAST MEDICAL HISTORY: Past Medical History  Diagnosis Date  . Gout     MEDICATIONS AT HOME: Prior to Admission medications   Medication Sig Start Date End Date Taking? Authorizing Provider  acetaminophen-codeine (TYLENOL #3) 300-30 MG per tablet Take 1 tablet by mouth every 8 (eight) hours as needed for moderate pain. 03/25/14   Tresa Garter, MD  diclofenac sodium (VOLTAREN) 1 % GEL Apply topically to the right knee 3 times a day. 03/08/14   Darlyne Russian, MD     Objective:   Filed Vitals:   03/25/14 1457  BP: 151/83  Pulse: 87  Temp: 98 F (36.7 C)  Resp: 16  Weight: 251 lb (113.853 kg)  SpO2: 100%    Exam General appearance : Awake, alert, not in any distress. Speech Clear. Not toxic looking HEENT: Atraumatic and Normocephalic, pupils equally reactive to light and accomodation Neck: supple, no JVD. No cervical lymphadenopathy.  Chest:Good air entry  bilaterally, no added sounds  CVS: S1 S2 regular, no murmurs.  Abdomen: Bowel sounds present, Non tender and not distended with no gaurding, rigidity or rebound. Extremities: B/L Lower Ext shows no edema, both legs are warm to touch Neurology: Awake alert, and oriented X 3, CN II-XII intact, Non focal Skin:No Rash Wounds:N/A  Data Review No results found for: HGBA1C   Assessment & Plan   1. Essential hypertension  - CBC with Differential - COMPLETE METABOLIC PANEL WITH GFR - Lipid panel - TSH - Urinalysis, Complete  2. Prediabetes  - POCT glycosylated hemoglobin (Hb A1C)  3. Encounter for screening mammogram for breast cancer  - MM Digital Screening; Future  4. Primary osteoarthritis of right knee  - acetaminophen-codeine (TYLENOL #3) 300-30 MG per tablet; Take 1 tablet by mouth every 8 (eight) hours as needed for moderate pain.  Dispense: 30 tablet; Refill: 0  5. Vitamin D deficiency  - Vitamin D, 25-hydroxy   Patient was counseled extensively about nutrition and exercise Patient declined to have colonoscopy done at this time.   Return in about 3 months (around 06/24/2014), or if symptoms worsen or fail to improve, for Follow up HTN, Routine Follow Up.  The patient was given clear instructions to go to ER or return to medical center if symptoms don't improve, worsen or new problems develop. The patient verbalized understanding. The patient was told to call to get lab  results if they haven't heard anything in the next week.   This note has been created with Surveyor, quantity. Any transcriptional errors are unintentional.    Angelica Chessman, MD, [place], [place], [place] and [place], [place]   03/25/2014, 3:47 PM

## 2014-03-26 LAB — URINALYSIS, COMPLETE
Bilirubin Urine: NEGATIVE
Casts: NONE SEEN
Crystals: NONE SEEN
Glucose, UA: NEGATIVE mg/dL
HGB URINE DIPSTICK: NEGATIVE
Ketones, ur: NEGATIVE mg/dL
NITRITE: NEGATIVE
PROTEIN: NEGATIVE mg/dL
SPECIFIC GRAVITY, URINE: 1.018 (ref 1.005–1.030)
UROBILINOGEN UA: 0.2 mg/dL (ref 0.0–1.0)
pH: 5 (ref 5.0–8.0)

## 2014-03-26 LAB — CBC WITH DIFFERENTIAL/PLATELET
BASOS PCT: 1 % (ref 0–1)
Basophils Absolute: 0.1 10*3/uL (ref 0.0–0.1)
EOS ABS: 0.1 10*3/uL (ref 0.0–0.7)
Eosinophils Relative: 2 % (ref 0–5)
HEMATOCRIT: 39 % (ref 36.0–46.0)
Hemoglobin: 13.1 g/dL (ref 12.0–15.0)
LYMPHS ABS: 1.6 10*3/uL (ref 0.7–4.0)
LYMPHS PCT: 30 % (ref 12–46)
MCH: 29.2 pg (ref 26.0–34.0)
MCHC: 33.6 g/dL (ref 30.0–36.0)
MCV: 87.1 fL (ref 78.0–100.0)
MONO ABS: 0.3 10*3/uL (ref 0.1–1.0)
MONOS PCT: 5 % (ref 3–12)
MPV: 11.5 fL (ref 8.6–12.4)
NEUTROS ABS: 3.3 10*3/uL (ref 1.7–7.7)
Neutrophils Relative %: 62 % (ref 43–77)
Platelets: 216 10*3/uL (ref 150–400)
RBC: 4.48 MIL/uL (ref 3.87–5.11)
RDW: 14.7 % (ref 11.5–15.5)
WBC: 5.3 10*3/uL (ref 4.0–10.5)

## 2014-03-26 LAB — VITAMIN D 25 HYDROXY (VIT D DEFICIENCY, FRACTURES): VIT D 25 HYDROXY: 45 ng/mL (ref 30–100)

## 2014-03-26 LAB — TSH: TSH: 1.374 u[IU]/mL (ref 0.350–4.500)

## 2014-03-30 DIAGNOSIS — M1711 Unilateral primary osteoarthritis, right knee: Secondary | ICD-10-CM | POA: Diagnosis not present

## 2014-04-12 ENCOUNTER — Telehealth: Payer: Self-pay | Admitting: *Deleted

## 2014-04-12 ENCOUNTER — Ambulatory Visit (HOSPITAL_COMMUNITY)
Admission: RE | Admit: 2014-04-12 | Discharge: 2014-04-12 | Disposition: A | Discharge status: Home / Self Care | Payer: Medicare Other | Source: Ambulatory Visit | Attending: Internal Medicine | Admitting: Internal Medicine

## 2014-04-12 DIAGNOSIS — Z1231 Encounter for screening mammogram for malignant neoplasm of breast: Secondary | ICD-10-CM | POA: Insufficient documentation

## 2014-04-12 NOTE — Telephone Encounter (Signed)
Pt is aware of her lab results.  

## 2014-04-12 NOTE — Telephone Encounter (Signed)
-----   Message from Tresa Garter, MD sent at 03/31/2014  4:05 PM EST ----- Please inform patient that her laboratory test results are mostly within normal limits. Vitamin D level is now normal. Cholesterol level still slightly high, encourage low-cholesterol low-fat diet to regular physical exercise. Thyroid function test is normal.

## 2014-04-14 ENCOUNTER — Other Ambulatory Visit: Payer: Self-pay | Admitting: Internal Medicine

## 2014-04-14 DIAGNOSIS — R928 Other abnormal and inconclusive findings on diagnostic imaging of breast: Secondary | ICD-10-CM

## 2014-04-21 ENCOUNTER — Ambulatory Visit
Admission: RE | Admit: 2014-04-21 | Discharge: 2014-04-21 | Disposition: A | Discharge status: Home / Self Care | Payer: Medicare Other | Source: Ambulatory Visit | Attending: Internal Medicine | Admitting: Internal Medicine

## 2014-04-21 DIAGNOSIS — R928 Other abnormal and inconclusive findings on diagnostic imaging of breast: Secondary | ICD-10-CM

## 2014-04-21 DIAGNOSIS — R921 Mammographic calcification found on diagnostic imaging of breast: Secondary | ICD-10-CM | POA: Diagnosis not present

## 2014-04-23 DIAGNOSIS — M25551 Pain in right hip: Secondary | ICD-10-CM | POA: Diagnosis not present

## 2014-04-23 DIAGNOSIS — M1711 Unilateral primary osteoarthritis, right knee: Secondary | ICD-10-CM | POA: Diagnosis not present

## 2014-10-22 ENCOUNTER — Other Ambulatory Visit: Payer: Self-pay | Admitting: Internal Medicine

## 2014-10-22 DIAGNOSIS — R921 Mammographic calcification found on diagnostic imaging of breast: Secondary | ICD-10-CM

## 2014-10-28 ENCOUNTER — Other Ambulatory Visit: Payer: Self-pay | Admitting: Internal Medicine

## 2014-10-28 ENCOUNTER — Ambulatory Visit
Admission: RE | Admit: 2014-10-28 | Discharge: 2014-10-28 | Disposition: A | Discharge status: Home / Self Care | Payer: Medicare Other | Source: Ambulatory Visit | Attending: Internal Medicine | Admitting: Internal Medicine

## 2014-10-28 DIAGNOSIS — R921 Mammographic calcification found on diagnostic imaging of breast: Secondary | ICD-10-CM

## 2014-10-28 DIAGNOSIS — R928 Other abnormal and inconclusive findings on diagnostic imaging of breast: Secondary | ICD-10-CM | POA: Diagnosis not present

## 2014-11-01 ENCOUNTER — Ambulatory Visit
Admission: RE | Admit: 2014-11-01 | Discharge: 2014-11-01 | Disposition: A | Discharge status: Home / Self Care | Payer: Medicare Other | Source: Ambulatory Visit | Attending: Internal Medicine | Admitting: Internal Medicine

## 2014-11-01 ENCOUNTER — Other Ambulatory Visit: Payer: Self-pay | Admitting: Internal Medicine

## 2014-11-01 DIAGNOSIS — R921 Mammographic calcification found on diagnostic imaging of breast: Secondary | ICD-10-CM

## 2014-11-01 DIAGNOSIS — D242 Benign neoplasm of left breast: Secondary | ICD-10-CM | POA: Diagnosis not present

## 2015-03-15 DIAGNOSIS — I480 Paroxysmal atrial fibrillation: Secondary | ICD-10-CM | POA: Diagnosis not present

## 2015-03-15 DIAGNOSIS — Z9851 Tubal ligation status: Secondary | ICD-10-CM | POA: Diagnosis not present

## 2015-03-15 DIAGNOSIS — Z7982 Long term (current) use of aspirin: Secondary | ICD-10-CM | POA: Diagnosis not present

## 2015-03-15 DIAGNOSIS — R55 Syncope and collapse: Secondary | ICD-10-CM | POA: Diagnosis not present

## 2015-03-15 DIAGNOSIS — R918 Other nonspecific abnormal finding of lung field: Secondary | ICD-10-CM | POA: Diagnosis not present

## 2015-03-15 DIAGNOSIS — R000 Tachycardia, unspecified: Secondary | ICD-10-CM | POA: Diagnosis not present

## 2015-03-15 DIAGNOSIS — E785 Hyperlipidemia, unspecified: Secondary | ICD-10-CM | POA: Diagnosis not present

## 2015-03-15 DIAGNOSIS — E876 Hypokalemia: Secondary | ICD-10-CM | POA: Diagnosis not present

## 2015-03-15 DIAGNOSIS — Z6841 Body Mass Index (BMI) 40.0 and over, adult: Secondary | ICD-10-CM | POA: Diagnosis not present

## 2015-03-15 DIAGNOSIS — I471 Supraventricular tachycardia: Secondary | ICD-10-CM | POA: Diagnosis not present

## 2015-03-15 LAB — PROTIME-INR: INR: 1.1 (ref 0.9–1.1)

## 2015-03-16 DIAGNOSIS — E876 Hypokalemia: Secondary | ICD-10-CM | POA: Diagnosis not present

## 2015-03-16 DIAGNOSIS — Z6841 Body Mass Index (BMI) 40.0 and over, adult: Secondary | ICD-10-CM | POA: Diagnosis not present

## 2015-03-16 DIAGNOSIS — R55 Syncope and collapse: Secondary | ICD-10-CM | POA: Diagnosis not present

## 2015-03-16 DIAGNOSIS — I480 Paroxysmal atrial fibrillation: Secondary | ICD-10-CM | POA: Diagnosis not present

## 2015-03-17 DIAGNOSIS — I480 Paroxysmal atrial fibrillation: Secondary | ICD-10-CM | POA: Diagnosis not present

## 2015-03-17 DIAGNOSIS — R55 Syncope and collapse: Secondary | ICD-10-CM | POA: Diagnosis not present

## 2015-03-17 DIAGNOSIS — E876 Hypokalemia: Secondary | ICD-10-CM | POA: Diagnosis not present

## 2015-04-07 ENCOUNTER — Other Ambulatory Visit: Payer: Self-pay

## 2015-04-07 ENCOUNTER — Encounter: Payer: Self-pay | Admitting: Internal Medicine

## 2015-04-07 ENCOUNTER — Ambulatory Visit: Payer: Medicare Other | Attending: Internal Medicine | Admitting: Internal Medicine

## 2015-04-07 VITALS — BP 158/88 | HR 92 | Temp 98.6°F | Resp 18 | Ht 63.5 in | Wt 232.0 lb

## 2015-04-07 DIAGNOSIS — E559 Vitamin D deficiency, unspecified: Secondary | ICD-10-CM | POA: Diagnosis not present

## 2015-04-07 DIAGNOSIS — M1711 Unilateral primary osteoarthritis, right knee: Secondary | ICD-10-CM

## 2015-04-07 DIAGNOSIS — I10 Essential (primary) hypertension: Secondary | ICD-10-CM | POA: Insufficient documentation

## 2015-04-07 DIAGNOSIS — R7303 Prediabetes: Secondary | ICD-10-CM

## 2015-04-07 LAB — CBC WITH DIFFERENTIAL/PLATELET
BASOS ABS: 0.1 10*3/uL (ref 0.0–0.1)
Basophils Relative: 1 % (ref 0–1)
EOS ABS: 0.1 10*3/uL (ref 0.0–0.7)
EOS PCT: 1 % (ref 0–5)
HCT: 40.7 % (ref 36.0–46.0)
Hemoglobin: 13.6 g/dL (ref 12.0–15.0)
Lymphocytes Relative: 37 % (ref 12–46)
Lymphs Abs: 2.1 10*3/uL (ref 0.7–4.0)
MCH: 29.1 pg (ref 26.0–34.0)
MCHC: 33.4 g/dL (ref 30.0–36.0)
MCV: 87 fL (ref 78.0–100.0)
MPV: 11.1 fL (ref 8.6–12.4)
Monocytes Absolute: 0.3 10*3/uL (ref 0.1–1.0)
Monocytes Relative: 6 % (ref 3–12)
NEUTROS PCT: 55 % (ref 43–77)
Neutro Abs: 3.2 10*3/uL (ref 1.7–7.7)
PLATELETS: 224 10*3/uL (ref 150–400)
RBC: 4.68 MIL/uL (ref 3.87–5.11)
RDW: 15.1 % (ref 11.5–15.5)
WBC: 5.8 10*3/uL (ref 4.0–10.5)

## 2015-04-07 LAB — COMPLETE METABOLIC PANEL WITH GFR
ALT: 12 U/L (ref 6–29)
AST: 20 U/L (ref 10–35)
Albumin: 4.3 g/dL (ref 3.6–5.1)
Alkaline Phosphatase: 55 U/L (ref 33–130)
BILIRUBIN TOTAL: 0.8 mg/dL (ref 0.2–1.2)
BUN: 8 mg/dL (ref 7–25)
CHLORIDE: 105 mmol/L (ref 98–110)
CO2: 25 mmol/L (ref 20–31)
Calcium: 9.2 mg/dL (ref 8.6–10.4)
Creat: 0.75 mg/dL (ref 0.50–0.99)
GFR, EST NON AFRICAN AMERICAN: 82 mL/min (ref >=60)
GFR, Est African American: >89 mL/min (ref >=60)
GLUCOSE: 91 mg/dL (ref 65–99)
Potassium: 4.3 mmol/L (ref 3.5–5.3)
SODIUM: 140 mmol/L (ref 135–146)
TOTAL PROTEIN: 7.4 g/dL (ref 6.1–8.1)

## 2015-04-07 LAB — GLUCOSE, POCT (MANUAL RESULT ENTRY): POC GLUCOSE: 77 mg/dL (ref 70–99)

## 2015-04-07 LAB — LIPID PANEL
CHOL/HDL RATIO: 4 ratio (ref <=5.0)
Cholesterol: 218 mg/dL — ABNORMAL HIGH (ref 125–200)
HDL: 55 mg/dL (ref >=46)
LDL CALC: 143 mg/dL — AB (ref <130)
TRIGLYCERIDES: 98 mg/dL (ref <150)
VLDL: 20 mg/dL (ref <30)

## 2015-04-07 LAB — URINALYSIS, COMPLETE
Bacteria, UA: NONE SEEN [HPF]
Bilirubin Urine: NEGATIVE
CASTS: NONE SEEN [LPF]
CRYSTALS: NONE SEEN [HPF]
Glucose, UA: NEGATIVE
Hgb urine dipstick: NEGATIVE
KETONES UR: NEGATIVE
NITRITE: NEGATIVE
PH: 5 (ref 5.0–8.0)
Protein, ur: NEGATIVE
SPECIFIC GRAVITY, URINE: 1.015 (ref 1.001–1.035)
YEAST: NONE SEEN [HPF]

## 2015-04-07 LAB — TSH: TSH: 1.385 u[IU]/mL (ref 0.350–4.500)

## 2015-04-07 LAB — POCT GLYCOSYLATED HEMOGLOBIN (HGB A1C): Hemoglobin A1C: 5.3

## 2015-04-07 NOTE — Progress Notes (Signed)
Patient here ofr HFU for Syncope and Routine Physcial  Patient denies any pain at this time. Patient denies any syncope since the ED visit.  Patient has declined the flu shot. Patient is not taking any medications except OTC vitamins.

## 2015-04-07 NOTE — Progress Notes (Signed)
Tara Hernandez, is a 70 y.o. female  [id number]  [id number]  DOB - 1945/08/07  CC:  Chief Complaint  Patient presents with  . Annual Exam  . Hospitalization Follow-up    Syncope       HPI: Tara Hernandez is a 70 y.o. female here today for hospital follow up and physical. Patient has history of essential hypertension, prediabetes, OA of the Rt. Knee and Vitamin D deficiency. Patient was seen in the ED in [place], following an eight hour car ride and a syncopal event with loss of consciousness. Patient was diagnosed at that time with Atrial Fibrillation and prescribed diltiazem and eliquis. Patient reports medication use for 2 days and stopped because she "didn't like the way the medicine made her feel".  Patient presents with no complaints but is concerned for recent diagnosis. Patient declines colonoscopy referral and flu shot, last mammogram 11/01/2014. Patient has No headache, No chest pain, No abdominal pain - No Nausea, No new weakness tingling or numbness, No Cough - SOB.  No Known Allergies Past Medical History  Diagnosis Date  . Gout    Current Outpatient Prescriptions on File Prior to Visit  Medication Sig Dispense Refill  . acetaminophen-codeine (TYLENOL #3) 300-30 MG per tablet Take 1 tablet by mouth every 8 (eight) hours as needed for moderate pain. (Patient not taking: Reported on 04/07/2015) 30 tablet 0   No current facility-administered medications on file prior to visit.   History reviewed. No pertinent family history. Social History   Social History  . Marital Status: Divorced    Spouse Name: N/A  . Number of Children: N/A  . Years of Education: N/A   Occupational History  . Not on file.   Social History Main Topics  . Smoking status: Never Smoker   . Smokeless tobacco: Never Used  . Alcohol Use: No  . Drug Use: No  . Sexual Activity: Not Currently   Other Topics Concern  . Not on file   Social History Narrative    Review of  Systems: Constitutional: Negative for fever, chills, diaphoresis, activity change, appetite change and fatigue. HENT: Negative for ear pain, nosebleeds, congestion, facial swelling, rhinorrhea, neck pain, neck stiffness and ear discharge.  Eyes: Negative for pain, discharge, redness, itching and visual disturbance. Respiratory: Negative for cough, choking, chest tightness, shortness of breath, wheezing and stridor.  Cardiovascular: Negative for chest pain, palpitations and leg swelling. Gastrointestinal: Negative for abdominal distention. Genitourinary: Negative for dysuria, urgency, frequency, hematuria, flank pain, decreased urine volume, difficulty urinating and dyspareunia.  Musculoskeletal: Negative for back pain, joint swelling, arthralgia and gait problem. Neurological: Negative for dizziness, tremors, seizures, syncope, facial asymmetry, speech difficulty, weakness, light-headedness, numbness and headaches.  Hematological: Negative for adenopathy. Does not bruise/bleed easily. Psychiatric/Behavioral: Negative for hallucinations, behavioral problems, confusion, dysphoric mood, decreased concentration and agitation.    Objective:   Filed Vitals:   04/07/15 1517  BP: 158/88  Pulse: 92  Temp: 98.6 F (37 C)  Resp: 18    Physical Exam: Constitutional: Patient appears well-developed and well-nourished. No distress. HENT: Normocephalic, atraumatic, External right and left ear normal. Oropharynx is clear and moist.  Eyes: Conjunctivae and EOM are normal. PERRLA, no scleral icterus. Neck: Normal ROM. Neck supple. No JVD. No tracheal deviation. No thyromegaly. CVS: RRR, S1/S2 +, no murmurs, no gallops, no carotid bruit.  Pulmonary: Effort and breath sounds normal, no stridor, rhonchi, wheezes, rales.  Abdominal: Soft. Obese, BS +, no distension, tenderness, rebound or guarding.  Musculoskeletal:  Normal range of motion. No edema and no tenderness.  Lymphadenopathy: No lymphadenopathy  noted. Neuro: Alert. Normal reflexes, muscle tone coordination. Skin: Skin is warm and dry. No rash noted. Not diaphoretic. No erythema. No pallor. Psychiatric: Normal mood and affect. Behavior, judgment, thought content normal.  Lab Results  Component Value Date   WBC 5.3 03/25/2014   HGB 13.1 03/25/2014   HCT 39.0 03/25/2014   MCV 87.1 03/25/2014   PLT 216 03/25/2014   Lab Results  Component Value Date   CREATININE 0.70 03/25/2014   BUN 14 03/25/2014   NA 139 03/25/2014   K 4.7 03/25/2014   CL 104 03/25/2014   CO2 25 03/25/2014    Lab Results  Component Value Date   HGBA1C 5.30 03/25/2014   Lipid Panel     Component Value Date/Time   CHOL 198 03/25/2014 1529   TRIG 77 03/25/2014 1529   HDL 55 03/25/2014 1529   CHOLHDL 3.6 03/25/2014 1529   VLDL 15 03/25/2014 1529   LDLCALC 128* 03/25/2014 1529       Assessment and plan:   Tara Hernandez was seen today for annual exam and hospitalization follow-up.  Diagnoses and all orders for this visit:  Prediabetes -     POCT A1C -     Glucose (CBG)  Essential hypertension -     CBC with Differential/Platelet -     COMPLETE METABOLIC PANEL WITH GFR -     Lipid panel -     TSH -     Urinalysis, Complete -     Echocardiogram; Future We have discussed target BP range and blood pressure goal. I have advised patient to check BP regularly and to call us back or report to clinic if the numbers are consistently higher than 140/90. We discussed the importance of compliance with medical therapy and DASH diet recommended, consequences of uncontrolled hypertension discussed. Patient agreed to plan and will return in 3 months for follow up to evaluate Lifestyle Modifications effectiveness. Patient will monitor her BP and record three to four times per week and bring with her to next visit.   Primary osteoarthritis of right knee  Hypovitaminosis D -     VITAMIN D 25 Hydroxy (Vit-D Deficiency, Fractures)   EKG shows sinus rhythm,  patient has no symptoms, heart rate is normal with normal rhythm. Will observe off anticoagulation. Patient has been counseled extensively about diagnosis and reason to return for evaluation immediately.Return in about 3 months (around 07/05/2015) for Follow up HTN, Follow up Pain and comorbidities.  The patient was given clear instructions to go to ER or return to medical center if symptoms don't improve, worsen or new problems develop. The patient verbalized understanding. The patient was told to call to get lab results if they haven't heard anything in the next week.     [facility] [phone] 04/07/2015, 4:19 PM   Evaluation and management procedures were performed by the Advanced Practitioner under my supervision and collaboration. I have reviewed the Advanced Practitioner's note and chart, and I agree with the management and plan.   Angelica Chessman, MD, [place], [place], [place], [place] and [place], [place]   04/25/2015, 6:00 PM

## 2015-04-07 NOTE — Patient Instructions (Signed)
DASH Eating Plan °DASH stands for "Dietary Approaches to Stop Hypertension." The DASH eating plan is a healthy eating plan that has been shown to reduce high blood pressure (hypertension). Additional health benefits may include reducing the risk of type 2 diabetes mellitus, heart disease, and stroke. The DASH eating plan may also help with weight loss. °WHAT DO I NEED TO KNOW ABOUT THE DASH EATING PLAN? °For the DASH eating plan, you will follow these general guidelines: °· Choose foods with a percent daily value for sodium of less than 5% (as listed on the food label). °· Use salt-free seasonings or herbs instead of table salt or sea salt. °· Check with your health care provider or pharmacist before using salt substitutes. °· Eat lower-sodium products, often labeled as "lower sodium" or "no salt added." °· Eat fresh foods. °· Eat more vegetables, fruits, and low-fat dairy products. °· Choose whole grains. Look for the word "whole" as the first word in the ingredient list. °· Choose fish and skinless chicken or turkey more often than red meat. Limit fish, poultry, and meat to 6 oz (170 g) each day. °· Limit sweets, desserts, sugars, and sugary drinks. °· Choose heart-healthy fats. °· Limit cheese to 1 oz (28 g) per day. °· Eat more home-cooked food and less restaurant, buffet, and fast food. °· Limit fried foods. °· Cook foods using methods other than frying. °· Limit canned vegetables. If you do use them, rinse them well to decrease the sodium. °· When eating at a restaurant, ask that your food be prepared with less salt, or no salt if possible. °WHAT FOODS CAN I EAT? °Seek help from a dietitian for individual calorie needs. °Grains °Whole grain or whole wheat bread. Brown rice. Whole grain or whole wheat pasta. Quinoa, bulgur, and whole grain cereals. Low-sodium cereals. Corn or whole wheat flour tortillas. Whole grain cornbread. Whole grain crackers. Low-sodium crackers. °Vegetables °Fresh or frozen vegetables  (raw, steamed, roasted, or grilled). Low-sodium or reduced-sodium tomato and vegetable juices. Low-sodium or reduced-sodium tomato sauce and paste. Low-sodium or reduced-sodium canned vegetables.  °Fruits °All fresh, canned (in natural juice), or frozen fruits. °Meat and Other Protein Products °Ground beef (85% or leaner), grass-fed beef, or beef trimmed of fat. Skinless chicken or turkey. Ground chicken or turkey. Pork trimmed of fat. All fish and seafood. Eggs. Dried beans, peas, or lentils. Unsalted nuts and seeds. Unsalted canned beans. °Dairy °Low-fat dairy products, such as skim or 1% milk, 2% or reduced-fat cheeses, low-fat ricotta or cottage cheese, or plain low-fat yogurt. Low-sodium or reduced-sodium cheeses. °Fats and Oils °Tub margarines without trans fats. Light or reduced-fat mayonnaise and salad dressings (reduced sodium). Avocado. Safflower, olive, or canola oils. Natural peanut or almond butter. °Other °Unsalted popcorn and pretzels. °The items listed above may not be a complete list of recommended foods or beverages. Contact your dietitian for more options. °WHAT FOODS ARE NOT RECOMMENDED? °Grains °White bread. White pasta. White rice. Refined cornbread. Bagels and croissants. Crackers that contain trans fat. °Vegetables °Creamed or fried vegetables. Vegetables in a cheese sauce. Regular canned vegetables. Regular canned tomato sauce and paste. Regular tomato and vegetable juices. °Fruits °Dried fruits. Canned fruit in light or heavy syrup. Fruit juice. °Meat and Other Protein Products °Fatty cuts of meat. Ribs, chicken wings, bacon, sausage, bologna, salami, chitterlings, fatback, hot dogs, bratwurst, and packaged luncheon meats. Salted nuts and seeds. Canned beans with salt. °Dairy °Whole or 2% milk, cream, half-and-half, and cream cheese. Whole-fat or sweetened yogurt. Full-fat   cheeses or blue cheese. Nondairy creamers and whipped toppings. Processed cheese, cheese spreads, or cheese  curds. °Condiments °Onion and garlic salt, seasoned salt, table salt, and sea salt. Canned and packaged gravies. Worcestershire sauce. Tartar sauce. Barbecue sauce. Teriyaki sauce. Soy sauce, including reduced sodium. Steak sauce. Fish sauce. Oyster sauce. Cocktail sauce. Horseradish. Ketchup and mustard. Meat flavorings and tenderizers. Bouillon cubes. Hot sauce. Tabasco sauce. Marinades. Taco seasonings. Relishes. °Fats and Oils °Butter, stick margarine, lard, shortening, ghee, and bacon fat. Coconut, palm kernel, or palm oils. Regular salad dressings. °Other °Pickles and olives. Salted popcorn and pretzels. °The items listed above may not be a complete list of foods and beverages to avoid. Contact your dietitian for more information. °WHERE CAN I FIND MORE INFORMATION? °National [employer], [employer], [employer]: www.nhlbi.nih.gov/health/health-topics/topics/dash/ °  °This information is not intended to replace advice given to you by your health care provider. Make sure you discuss any questions you have with your health care provider. °  °Document Released: 02/08/2011 Document Revised: 03/12/2014 Document Reviewed: 12/24/2012 °Elsevier Interactive Patient Education ©2016 Elsevier Inc. ° °Hypertension °Hypertension, commonly called high blood pressure, is when the force of blood pumping through your arteries is too strong. Your arteries are the blood vessels that carry blood from your heart throughout your body. A blood pressure reading consists of a higher number over a lower number, such as 110/72. The higher number (systolic) is the pressure inside your arteries when your heart pumps. The lower number (diastolic) is the pressure inside your arteries when your heart relaxes. Ideally you want your blood pressure below 120/80. °Hypertension forces your heart to work harder to pump blood. Your arteries may become narrow or stiff. Having untreated or uncontrolled hypertension can cause heart attack, stroke, kidney  disease, and other problems. °RISK FACTORS °Some risk factors for high blood pressure are controllable. Others are not.  °Risk factors you cannot control include:  °· Race. You may be at higher risk if you are African American. °· Age. Risk increases with age. °· Gender. Men are at higher risk than women before age 45 years. After age 65, women are at higher risk than men. °Risk factors you can control include: °· Not getting enough exercise or physical activity. °· Being overweight. °· Getting too much fat, sugar, calories, or salt in your diet. °· Drinking too much alcohol. °SIGNS AND SYMPTOMS °Hypertension does not usually cause signs or symptoms. Extremely high blood pressure (hypertensive crisis) may cause headache, anxiety, shortness of breath, and nosebleed. °DIAGNOSIS °To check if you have hypertension, your health care provider will measure your blood pressure while you are seated, with your arm held at the level of your heart. It should be measured at least twice using the same arm. Certain conditions can cause a difference in blood pressure between your right and left arms. A blood pressure reading that is higher than normal on one occasion does not mean that you need treatment. If it is not clear whether you have high blood pressure, you may be asked to return on a different day to have your blood pressure checked again. Or, you may be asked to monitor your blood pressure at home for 1 or more weeks. °TREATMENT °Treating high blood pressure includes making lifestyle changes and possibly taking medicine. Living a healthy lifestyle can help lower high blood pressure. You may need to change some of your habits. °Lifestyle changes may include: °· Following the DASH diet. This diet is high in fruits, vegetables, and whole   grains. It is low in salt, red meat, and added sugars. °· Keep your sodium intake below 2,300 mg per day. °· Getting at least 30-45 minutes of aerobic exercise at least 4 times per  week. °· Losing weight if necessary. °· Not smoking. °· Limiting alcoholic beverages. °· Learning ways to reduce stress. °Your health care provider may prescribe medicine if lifestyle changes are not enough to get your blood pressure under control, and if one of the following is true: °· You are 18-59 years of age and your systolic blood pressure is above 140. °· You are 60 years of age or older, and your systolic blood pressure is above 150. °· Your diastolic blood pressure is above 90. °· You have diabetes, and your systolic blood pressure is over 140 or your diastolic blood pressure is over 90. °· You have kidney disease and your blood pressure is above 140/90. °· You have heart disease and your blood pressure is above 140/90. °Your personal target blood pressure may vary depending on your medical conditions, your age, and other factors. °HOME CARE INSTRUCTIONS °· Have your blood pressure rechecked as directed by your health care provider.   °· Take medicines only as directed by your health care provider. Follow the directions carefully. Blood pressure medicines must be taken as prescribed. The medicine does not work as well when you skip doses. Skipping doses also puts you at risk for problems. °· Do not smoke.   °· Monitor your blood pressure at home as directed by your health care provider.  °SEEK MEDICAL CARE IF:  °· You think you are having a reaction to medicines taken. °· You have recurrent headaches or feel dizzy. °· You have swelling in your ankles. °· You have trouble with your vision. °SEEK IMMEDIATE MEDICAL CARE IF: °· You develop a severe headache or confusion. °· You have unusual weakness, numbness, or feel faint. °· You have severe chest or abdominal pain. °· You vomit repeatedly. °· You have trouble breathing. °MAKE SURE YOU:  °· Understand these instructions. °· Will watch your condition. °· Will get help right away if you are not doing well or get worse. °  °This information is not intended to  replace advice given to you by your health care provider. Make sure you discuss any questions you have with your health care provider. °  °Document Released: 02/19/2005 Document Revised: 07/06/2014 Document Reviewed: 12/12/2012 °Elsevier Interactive Patient Education ©2016 Elsevier Inc. ° °

## 2015-04-08 ENCOUNTER — Ambulatory Visit (HOSPITAL_COMMUNITY)
Admission: RE | Admit: 2015-04-08 | Discharge: 2015-04-08 | Disposition: A | Discharge status: Home / Self Care | Payer: Medicare Other | Source: Ambulatory Visit | Attending: Internal Medicine | Admitting: Internal Medicine

## 2015-04-08 DIAGNOSIS — I340 Nonrheumatic mitral (valve) insufficiency: Secondary | ICD-10-CM | POA: Diagnosis not present

## 2015-04-08 DIAGNOSIS — I4891 Unspecified atrial fibrillation: Secondary | ICD-10-CM | POA: Insufficient documentation

## 2015-04-08 DIAGNOSIS — I10 Essential (primary) hypertension: Secondary | ICD-10-CM | POA: Diagnosis not present

## 2015-04-08 LAB — VITAMIN D 25 HYDROXY (VIT D DEFICIENCY, FRACTURES): VIT D 25 HYDROXY: 52 ng/mL (ref 30–100)

## 2015-04-08 NOTE — Progress Notes (Signed)
  Echocardiogram 2D Echocardiogram has been performed.  Tara Hernandez 04/08/2015, 2:49 PM

## 2015-04-12 ENCOUNTER — Telehealth: Payer: Self-pay | Admitting: *Deleted

## 2015-04-12 NOTE — Telephone Encounter (Signed)
Medical Assistant left message on patient's home and cell voicemail. Voicemail states to give a call back to Melida Northington with [facility] at [phone].  

## 2015-04-12 NOTE — Telephone Encounter (Signed)
-----   Message from Tresa Garter, MD sent at 04/08/2015 12:12 PM EST ----- Please inform patient that her laboratory results are mostly within normal limits except for her cholesterol level.To address this please limit saturated fat to no more than 7% of your calories, limit cholesterol to 200 mg/day, increase fiber and exercise as tolerated. If needed we may add cholesterol lowering medication to your regimen.

## 2015-04-20 NOTE — Telephone Encounter (Signed)
Patient verified DOB Patient made aware of lab results are mostly normal except for cholesterol being elevated. Patient advised to limit saturated fats and to increase fiber and exercise. Patient states she has implemented the changes and hopes to see a better result at the next visit. Patient also made aware of echocardiogram being normal. Patient expressed her understanding and had no further questions.

## 2015-04-20 NOTE — Telephone Encounter (Signed)
-----   Message from Tresa Garter, MD sent at 04/18/2015  3:33 PM EST ----- Please inform patient of normal echocardiogram.

## 2015-04-25 ENCOUNTER — Other Ambulatory Visit: Payer: Self-pay | Admitting: Internal Medicine

## 2015-04-25 DIAGNOSIS — R921 Mammographic calcification found on diagnostic imaging of breast: Secondary | ICD-10-CM

## 2015-05-02 ENCOUNTER — Other Ambulatory Visit: Payer: Self-pay | Admitting: Internal Medicine

## 2015-05-02 ENCOUNTER — Other Ambulatory Visit: Payer: Self-pay

## 2015-05-02 DIAGNOSIS — R921 Mammographic calcification found on diagnostic imaging of breast: Secondary | ICD-10-CM

## 2015-05-04 ENCOUNTER — Ambulatory Visit
Admission: RE | Admit: 2015-05-04 | Discharge: 2015-05-04 | Disposition: A | Discharge status: Home / Self Care | Payer: Medicare Other | Source: Ambulatory Visit | Attending: Internal Medicine | Admitting: Internal Medicine

## 2015-05-04 DIAGNOSIS — R921 Mammographic calcification found on diagnostic imaging of breast: Secondary | ICD-10-CM | POA: Diagnosis not present

## 2015-05-06 ENCOUNTER — Telehealth: Payer: Self-pay | Admitting: *Deleted

## 2015-05-06 NOTE — Telephone Encounter (Signed)
Patient verified DOB Patient informed of mammogram screening showing no evidence of malignancy. Patient advised to have a repeat mammogram in one year. Patient expressed her understanding and had no further questions at this time.

## 2015-05-06 NOTE — Telephone Encounter (Signed)
-----   Message from Tresa Garter, MD sent at 05/06/2015  3:10 PM EST ----- Please inform patient that her screening mammogram shows no evidence of malignancy. Recommend screening mammogram in one year

## 2015-06-24 IMAGING — CR DG FOOT COMPLETE 3+V*R*
3 series · 3 of 3 positions shown · non-contrast
Comparison: No priors.

CLINICAL DATA: Foot pain.  History of gout.

RIGHT FOOT COMPLETE - 3+ VIEW

[x foot ap right]
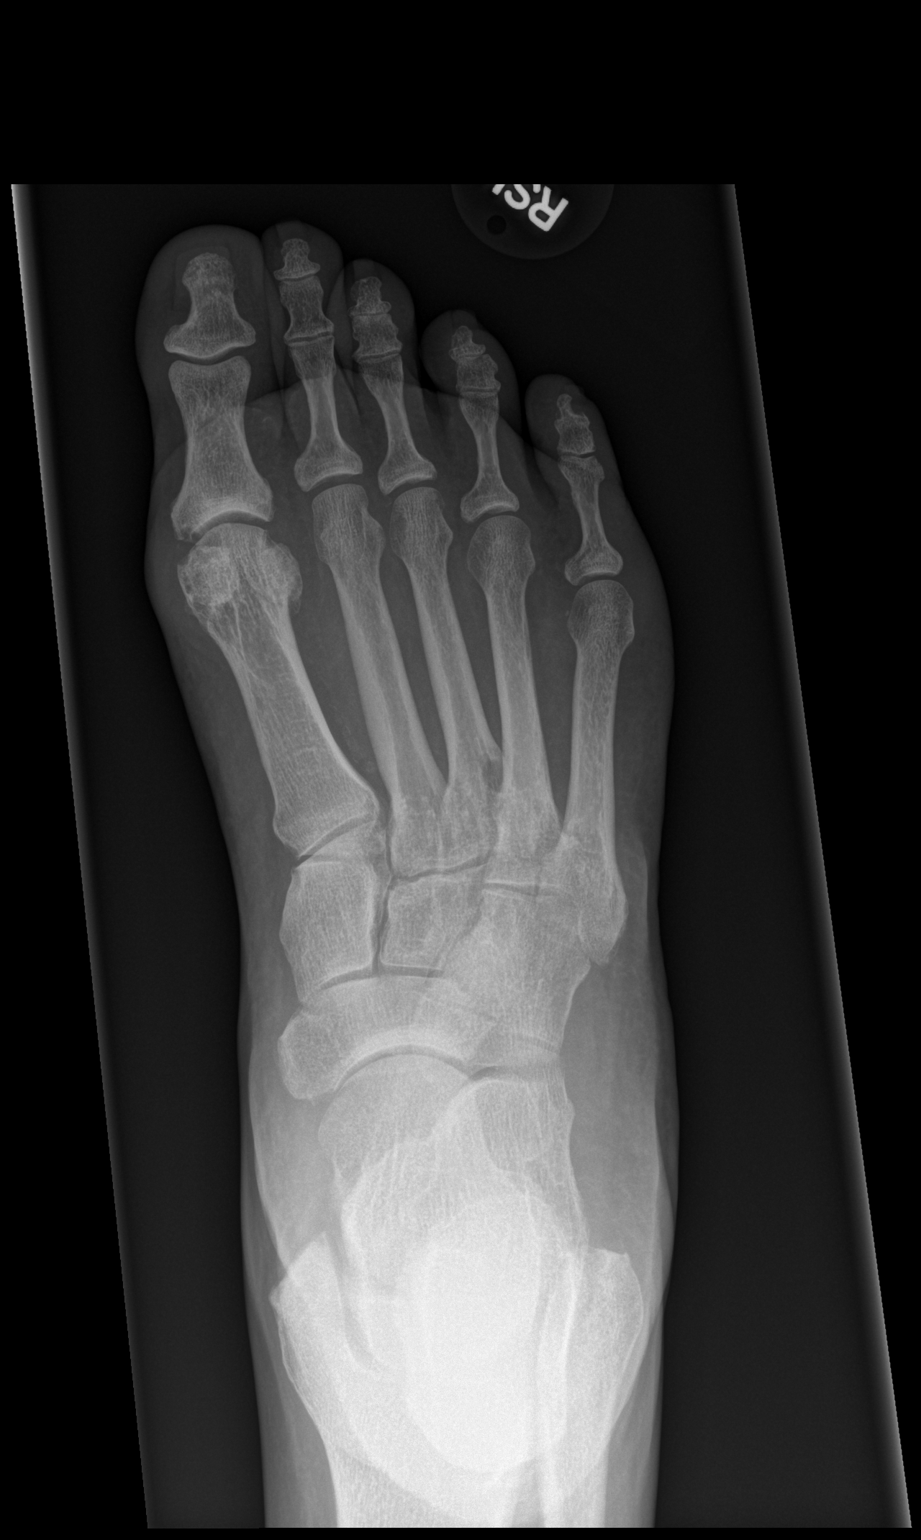

[x foot obl right]
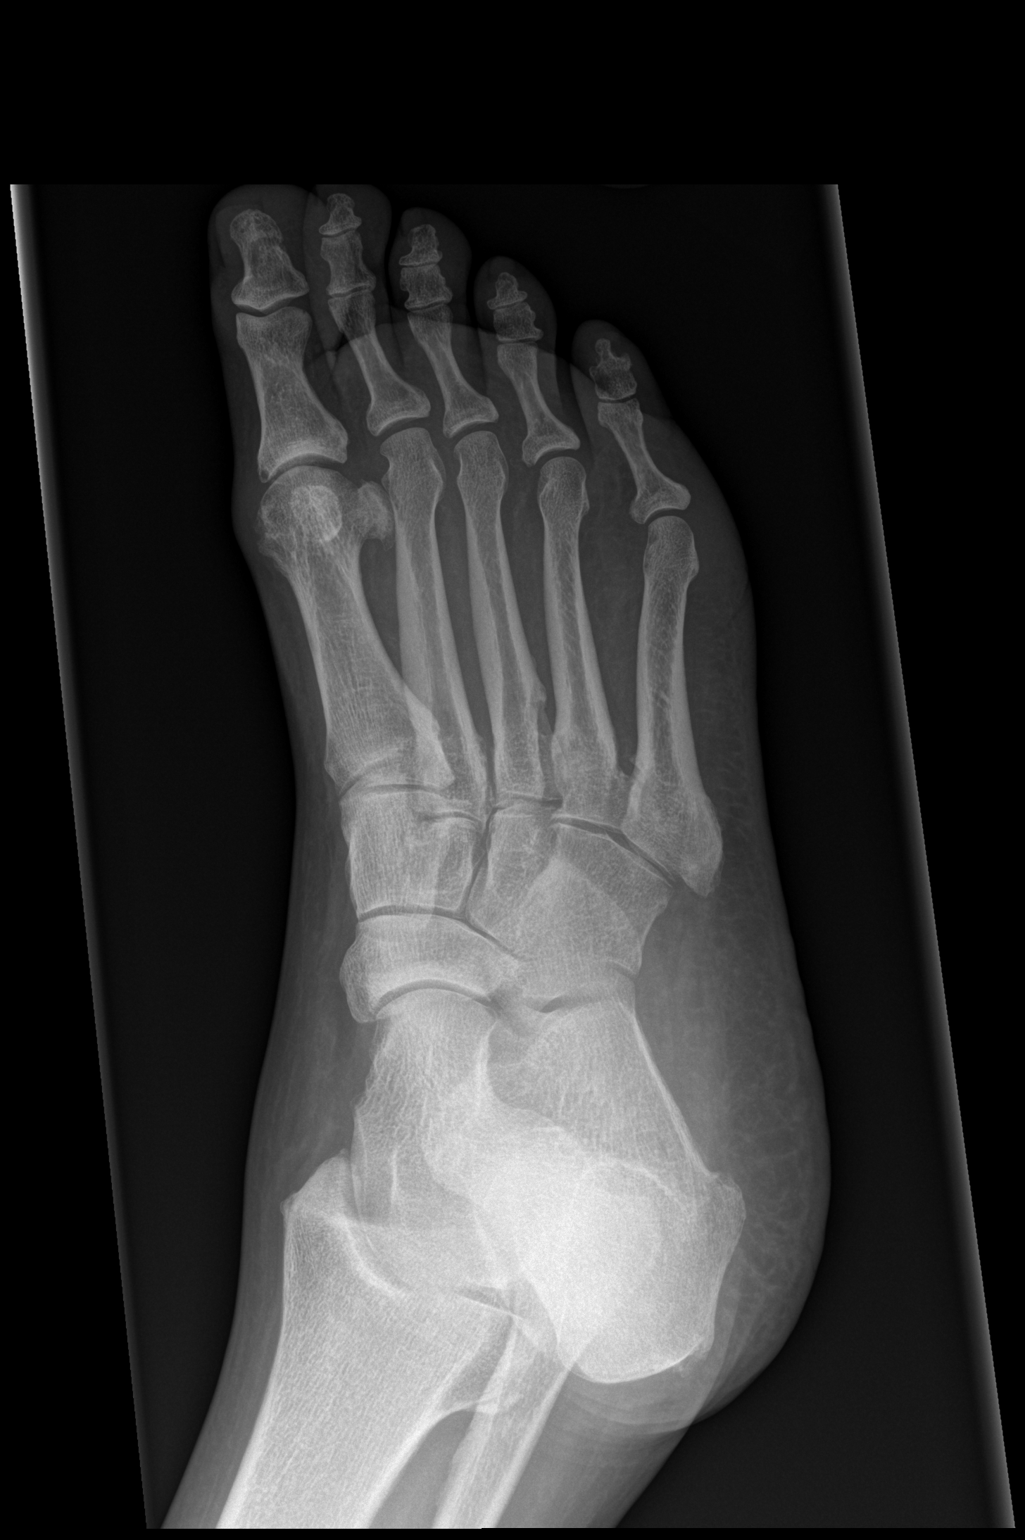

[x foot lat right]
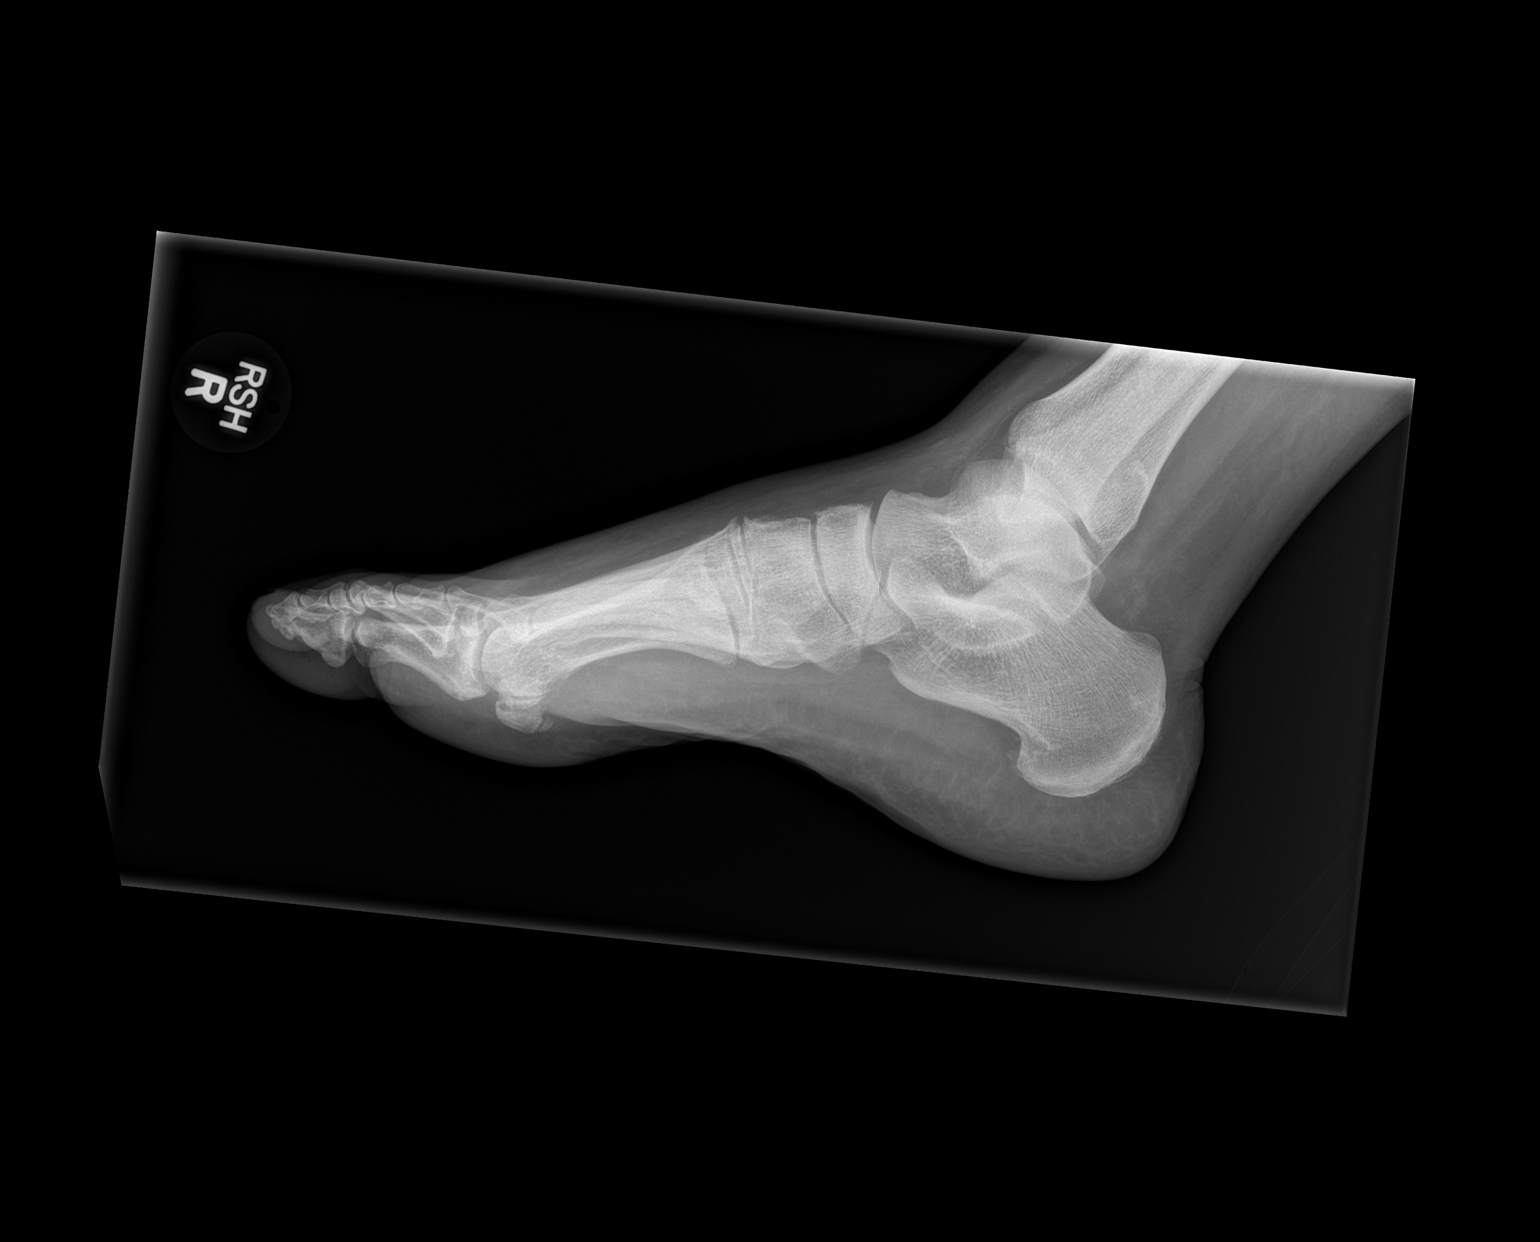

[3 of 3 positions shown; findings below may reference images not displayed]

FINDINGS: In the medial and lateral aspect of the distal metatarsal
and there are bony erosions adjacent to the joint space.  There may
also be a small bony erosion at the medial base of the first
metatarsal.  A large bony erosion is noted in the lateral base of
the third metatarsal.  Prominence of soft tissues adjacent to the
first MTP joint.  Degenerative changes of osteoarthritis are noted,
most pronounced at the first MTP joint.  No definite acute
displaced fracture, subluxation or dislocation.
IMPRESSION: 1.  Bony and soft tissue changes suggestive of gout, as above.
2.  Negative for acute fracture.

## 2015-08-26 ENCOUNTER — Telehealth: Payer: Self-pay | Admitting: Internal Medicine

## 2015-08-26 NOTE — Telephone Encounter (Signed)
Pt dropped off paperwork for SCAT Pt would like it to be mailed back to her  Pt was informed it can take up to 7-14 business days but she stated she needed it as soon as possible Thank you

## 2015-08-30 NOTE — Telephone Encounter (Signed)
MA received paperwork and will contact patient once it has been completed and placed in the mailing box.

## 2015-09-21 NOTE — Telephone Encounter (Signed)
Patient verified DOB Patient is aware of paperwork being completed and mailed back to her at her request. Patient expressed her understanding and confirmed the current address. No further questions at this time.

## 2015-09-28 ENCOUNTER — Telehealth: Payer: Self-pay | Admitting: Internal Medicine

## 2015-09-28 NOTE — Telephone Encounter (Signed)
Patient called about SCAT paperwork, she had requested that it be mailed to the house. Patient would like to speak with nurse. Please follow up. lisb

## 2015-10-03 NOTE — Telephone Encounter (Signed)
Patient verified DOB Patient states she checked her box last Tuesday and had not received the paperwork. Patient states she will check this week and call back to confirm if paperwork was received. No further questions at this time.

## 2015-10-04 ENCOUNTER — Telehealth: Payer: Self-pay | Admitting: Internal Medicine

## 2015-10-04 NOTE — Telephone Encounter (Signed)
Pt called requesting paperwork to be mailed to her once again, pt states she has not received it and was told to call today to request it. Please f/up

## 2015-10-12 NOTE — Telephone Encounter (Signed)
Medical Assistant left message on patient's home and cell voicemail. Voicemail states to give a call back to [place] with [facility] at [phone].  !!!Please inform patient of SCAT paperwork being scanned into the chart. MA mailed for the second time paperwork to patient!!!

## 2016-02-21 ENCOUNTER — Telehealth: Payer: Self-pay | Admitting: Internal Medicine

## 2016-02-21 NOTE — Telephone Encounter (Signed)
I do not have the paperwork with me. Could you let me know if the return copy was in his box? Or next to my desk is my faxes next to the colorful accordion. Look there and see if the faxed copy is there. I cant help today since I am not there but she is saying she needs it resent today.

## 2016-02-21 NOTE — Telephone Encounter (Signed)
Patient called the office to speak with PCP regarding the application for SCAT that she had dropped off for him to fill out. Pt was informed by company that PCP didn't fill out question #[phone]. Please have him do so and re fax it to [phone] Attn: Marissa Calamity. [place]. The deadline was yesterday but is insisting on it being faxed. Please follow up.   Thank you.

## 2016-08-31 ENCOUNTER — Encounter: Payer: Self-pay | Admitting: Internal Medicine

## 2016-08-31 ENCOUNTER — Ambulatory Visit: Payer: Medicare Other | Attending: Internal Medicine | Admitting: Internal Medicine

## 2016-08-31 VITALS — BP 170/95 | HR 83 | Temp 98.5°F | Resp 16 | Wt 235.8 lb

## 2016-08-31 DIAGNOSIS — R7303 Prediabetes: Secondary | ICD-10-CM | POA: Insufficient documentation

## 2016-08-31 DIAGNOSIS — E559 Vitamin D deficiency, unspecified: Secondary | ICD-10-CM | POA: Insufficient documentation

## 2016-08-31 DIAGNOSIS — E669 Obesity, unspecified: Secondary | ICD-10-CM | POA: Insufficient documentation

## 2016-08-31 DIAGNOSIS — M19049 Primary osteoarthritis, unspecified hand: Secondary | ICD-10-CM | POA: Diagnosis not present

## 2016-08-31 DIAGNOSIS — M5432 Sciatica, left side: Secondary | ICD-10-CM

## 2016-08-31 DIAGNOSIS — M1711 Unilateral primary osteoarthritis, right knee: Secondary | ICD-10-CM | POA: Insufficient documentation

## 2016-08-31 DIAGNOSIS — Z6841 Body Mass Index (BMI) 40.0 and over, adult: Secondary | ICD-10-CM | POA: Diagnosis not present

## 2016-08-31 DIAGNOSIS — I10 Essential (primary) hypertension: Secondary | ICD-10-CM | POA: Insufficient documentation

## 2016-08-31 DIAGNOSIS — Z780 Asymptomatic menopausal state: Secondary | ICD-10-CM | POA: Insufficient documentation

## 2016-08-31 DIAGNOSIS — R600 Localized edema: Secondary | ICD-10-CM | POA: Diagnosis not present

## 2016-08-31 DIAGNOSIS — IMO0001 Reserved for inherently not codable concepts without codable children: Secondary | ICD-10-CM

## 2016-08-31 NOTE — Patient Instructions (Signed)
Your blood pressure is elevated. This puts you at risk for heart attack and strokes. I recommend starting blood pressure medication. However a you want to delay in doing this. Please check your blood pressure at least 3 times a week and record the readings. Please follow-up in one month for repeat blood pressure check. Normal blood pressure is 120/80 or lower.

## 2016-08-31 NOTE — Progress Notes (Signed)
Patient ID: Tara Hernandez, female    DOB: 02-14-1946  MRN: [id number]  CC: re-establish   Subjective: Tara Hernandez is a 71 y.o. female who presents to be re-establish Her concerns today include:  Pt with hx of OA, pre-DM, HTN, HL and vit D def. Last seen in February 2017  1. HTN: -Not on medications for this -checks BP with wrist monitor once a wk. Gives range 128-150/68-70s  -limits salt in foods -Does some squat and yoga for exercise. Limited in the amount of walking that she can do due to pain in the right knee. -no CP/SOB with exertion or rest, no HA/dizziness -+ LE edema with prolong sitting  2. Pre-DM -cut back on sugar. Uses whole grain bread.  Given information/list last yr which she tries to follow. Bake meat; eats fried fish occasionally  3. OA RT knee -pain level depends on the weather; worse in cloudy weather.  Takes Aleve occasionally.  -Some pain in the hands and rainy weather also. -takes Calcium with vit D 500mg /1000 IU. 1-2 x a day.   4.  Sometimes she has LT side back pain radiating down LT leg with numbness. Occurs only when she over does it with lifting  HM: does not want colonoscopy.  Does not want Prevnar 13, Tdap  Patient Active Problem List   Diagnosis Date Noted  . 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 Vitamin D deficiency 03/25/2014  . Encounter for screening mammogram for breast cancer 03/25/2014  . Prediabetes 03/25/2014  . Essential hypertension 03/25/2014  . Primary osteoarthritis of right knee 03/25/2014  . Osteoarthritis of right knee 03/08/2014     Current Outpatient Prescriptions on File Prior to Visit  Medication Sig Dispense Refill  . acetaminophen-codeine (TYLENOL #3) 300-30 MG per tablet Take 1 tablet by mouth every 8 (eight) hours as needed for moderate pain. (Patient not taking: Reported on 04/07/2015) 30 tablet 0   No current facility-administered medications on file prior to visit.     No Known Allergies  Social History    Social History  . Marital status: Divorced    Spouse name: N/A  . Number of children: N/A  . Years of education: N/A   Occupational History  . Not on file.   Social History Main Topics  . Smoking status: Never Smoker  . Smokeless tobacco: Never Used  . Alcohol use No  . Drug use: No  . Sexual activity: Not Currently   Other Topics Concern  . Not on file   Social History Narrative  . No narrative on file    No family history on file.  Past Surgical History:  Procedure Laterality Date  . EYE SURGERY    . TUBAL LIGATION  1983    ROS: Review of Systems As stated above PHYSICAL EXAM: BP (!) 170/95   Pulse 83   Temp 98.5 F (36.9 C) (Oral)   Resp 16   Wt 235 lb 12.8 oz (107 kg)   SpO2 98%   BMI 41.11 kg/m    repeat BP 178/94 Wt Readings from Last 3 Encounters:  08/31/16 235 lb 12.8 oz (107 kg)  04/07/15 232 lb (105.2 kg)  03/25/14 251 lb (113.9 kg)   Physical Exam  General appearance - alert, well appearing, pleasant elderly African-American female and in no distress Mental status - alert, oriented to person, place, and time, normal mood, behavior, speech, dress, motor activity, and thought processes Eyes - pupils equal and reactive, extraocular eye movements intact. Pink conjunctiva  Nose - normal and patent, no erythema, discharge or polyps Mouth - mucous membranes moist, pharynx normal without lesions Neck - supple, no significant adenopathy Chest - clear to auscultation, no wheezes, rales or rhonchi, symmetric air entry Heart - normal rate, regular rhythm, normal S1, S2, no murmurs, rubs, clicks or gallops Musculoskeletal - enlargement of the PIP and some DIP joints more prominent on the right hand Extremities - trace lower extremity edema  Depression screen [facility] 2/9 08/31/2016 04/07/2015 03/25/2014  Decreased Interest 0 0 0  Down, Depressed, Hopeless 0 0 0  PHQ - 2 Score 0 0 0   ASSESSMENT AND PLAN: 1. Essential hypertension -Not at goal. Discussed  health risks associated with uncontrolled blood pressure including cardiovascular events. Recommend starting antihypertensive. Patient declines stating that she would like to be given 6 months to get her blood pressure down through exercise. I advised against waiting this long but patient insists. -Advised to avoid daily use of over-the-counter NSAIDs including Aleve, Advil, ibuprofen, Naprosyn. Patient reports she takes Aleve occasionally for arthritis pain - CBC - Comprehensive metabolic panel - Lipid panel  2. Primary osteoarthritis of right knee -Encourage weight loss through healthy eating and regular exercise. Recommend water aerobics if she has access to a pool. She does have Silver sneakers through her health insurance and will look into finding a gym that has a pool.  3. Sciatica of left side -Advised to avoid activities that cause flareups.  4. Prediabetes *- Hemoglobin A1c  5. Osteoarthritis of finger, unspecified laterality   6. Class 3 obesity with serious comorbidity and body mass index (BMI) of 40.0 to 44.9 in adult, unspecified obesity type ([place]) -See #2 above 7. Vitamin D deficiency - VITAMIN D 25 Hydroxy (Vit-D Deficiency, Fractures)  8. Postmenopausal state - DG Bone Density; Future  Patient declines colonoscopy, Prevnar 13, hep C screen and Tdap.  Patient was given the opportunity to ask questions.  Patient verbalized understanding of the plan and was able to repeat key elements of the plan.   Orders Placed This Encounter  Procedures  . DG Bone Density  . CBC  . Comprehensive metabolic panel  . Lipid panel  . VITAMIN D 25 Hydroxy (Vit-D Deficiency, Fractures)  . Hemoglobin A1c     Requested Prescriptions    No prescriptions requested or ordered in this encounter    Return in about 1 month (around 09/30/2016).  Karle Plumber, MD, FACP

## 2016-09-01 LAB — HEMOGLOBIN A1C
ESTIMATED AVERAGE GLUCOSE: 105 mg/dL
HEMOGLOBIN A1C: 5.3 % (ref 4.8–5.6)

## 2016-09-01 LAB — CBC
Hematocrit: 38 % (ref 34.0–46.6)
Hemoglobin: 12.8 g/dL (ref 11.1–15.9)
MCH: 29.4 pg (ref 26.6–33.0)
MCHC: 33.7 g/dL (ref 31.5–35.7)
MCV: 87 fL (ref 79–97)
PLATELETS: 180 10*3/uL (ref 150–379)
RBC: 4.36 x10E6/uL (ref 3.77–5.28)
RDW: 14.9 % (ref 12.3–15.4)
WBC: 5.1 10*3/uL (ref 3.4–10.8)

## 2016-09-01 LAB — LIPID PANEL
CHOL/HDL RATIO: 3.3 ratio (ref 0.0–4.4)
Cholesterol, Total: 192 mg/dL (ref 100–199)
HDL: 58 mg/dL (ref >39)
LDL Calculated: 118 mg/dL — ABNORMAL HIGH (ref 0–99)
Triglycerides: 79 mg/dL (ref 0–149)
VLDL Cholesterol Cal: 16 mg/dL (ref 5–40)

## 2016-09-01 LAB — COMPREHENSIVE METABOLIC PANEL
A/G RATIO: 1.4 (ref 1.2–2.2)
ALT: 11 IU/L (ref 0–32)
AST: 22 IU/L (ref 0–40)
Albumin: 4.2 g/dL (ref 3.5–4.8)
Alkaline Phosphatase: 65 IU/L (ref 39–117)
BILIRUBIN TOTAL: 0.5 mg/dL (ref 0.0–1.2)
BUN / CREAT RATIO: 13 (ref 12–28)
BUN: 11 mg/dL (ref 8–27)
CHLORIDE: 104 mmol/L (ref 96–106)
CO2: 23 mmol/L (ref 20–29)
Calcium: 9.8 mg/dL (ref 8.7–10.3)
Creatinine, Ser: 0.83 mg/dL (ref 0.57–1.00)
GFR calc non Af Amer: 71 mL/min/{1.73_m2} (ref >59)
GFR, EST AFRICAN AMERICAN: 82 mL/min/{1.73_m2} (ref >59)
GLOBULIN, TOTAL: 3 g/dL (ref 1.5–4.5)
Glucose: 95 mg/dL (ref 65–99)
POTASSIUM: 4.1 mmol/L (ref 3.5–5.2)
SODIUM: 143 mmol/L (ref 134–144)
TOTAL PROTEIN: 7.2 g/dL (ref 6.0–8.5)

## 2016-09-01 LAB — VITAMIN D 25 HYDROXY (VIT D DEFICIENCY, FRACTURES): VIT D 25 HYDROXY: 54.4 ng/mL (ref 30.0–100.0)

## 2016-10-04 ENCOUNTER — Encounter: Payer: Self-pay | Admitting: Internal Medicine

## 2016-10-04 ENCOUNTER — Ambulatory Visit: Payer: Medicare Other | Attending: Internal Medicine | Admitting: Internal Medicine

## 2016-10-04 VITALS — BP 159/91 | HR 101 | Temp 98.3°F | Resp 16 | Wt 229.4 lb

## 2016-10-04 DIAGNOSIS — M1711 Unilateral primary osteoarthritis, right knee: Secondary | ICD-10-CM

## 2016-10-04 DIAGNOSIS — E559 Vitamin D deficiency, unspecified: Secondary | ICD-10-CM | POA: Insufficient documentation

## 2016-10-04 DIAGNOSIS — I10 Essential (primary) hypertension: Secondary | ICD-10-CM | POA: Diagnosis not present

## 2016-10-04 DIAGNOSIS — Z09 Encounter for follow-up examination after completed treatment for conditions other than malignant neoplasm: Secondary | ICD-10-CM | POA: Insufficient documentation

## 2016-10-04 DIAGNOSIS — E669 Obesity, unspecified: Secondary | ICD-10-CM | POA: Insufficient documentation

## 2016-10-04 DIAGNOSIS — Z1211 Encounter for screening for malignant neoplasm of colon: Secondary | ICD-10-CM | POA: Diagnosis not present

## 2016-10-04 DIAGNOSIS — R7303 Prediabetes: Secondary | ICD-10-CM | POA: Diagnosis not present

## 2016-10-04 DIAGNOSIS — Z6841 Body Mass Index (BMI) 40.0 and over, adult: Secondary | ICD-10-CM | POA: Diagnosis not present

## 2016-10-04 DIAGNOSIS — IMO0001 Reserved for inherently not codable concepts without codable children: Secondary | ICD-10-CM

## 2016-10-04 NOTE — Progress Notes (Signed)
Patient ID: Tara Hernandez, female    DOB: 20-Nov-1945  MRN: [id number]  CC: Follow-up   Subjective: Tara Hernandez is a 71 y.o. female who presents for 1 mth f/u visit on HTN, obesity and OA RT knee Her concerns today include:  1. HTN  -antihypertensive med advised on last visit but pt declined stating that she wanted to get "in 6 months to get blood pressure down through exercise and change in eating habits. -Since last visit she has been checking her blood pressure daily with a wrist device Has device with her. SBP has been in 120-130s and DBP in 70s until this wk.  This wk SBP in the 140-150s.  She thinks increase is due to her being nervous about coming to this appt. -changed eating habits a lot. Loves fruits but allergic to insecticides used on them but can eat  bananas -change to whole grain bread and only eats one slice with sandwiches -also walking more.  "I walked the other day without any pain in my knee"  -loss 6 lbs since last visit  2.  Bone density ordered on last visit. Pt states she did not get appt as yet. -Vit D level was good on blood test. A1C was 5.3, not in range for pre-DM any more Patient Active Problem List   Diagnosis Date Noted  . 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 Vitamin D deficiency 03/25/2014  . Encounter for screening mammogram for breast cancer 03/25/2014  . Prediabetes 03/25/2014  . Essential hypertension 03/25/2014  . Primary osteoarthritis of right knee 03/25/2014  . Osteoarthritis of right knee 03/08/2014     Current Outpatient Prescriptions on File Prior to Visit  Medication Sig Dispense Refill  . acetaminophen-codeine (TYLENOL #3) 300-30 MG per tablet Take 1 tablet by mouth every 8 (eight) hours as needed for moderate pain. (Patient not taking: Reported on 04/07/2015) 30 tablet 0   No current facility-administered medications on file prior to visit.     No Known Allergies  Social History   Social History  . Marital status: Divorced   Spouse name: N/A  . Number of children: N/A  . Years of education: N/A   Occupational History  . Not on file.   Social History Main Topics  . Smoking status: Never Smoker  . Smokeless tobacco: Never Used  . Alcohol use No  . Drug use: No  . Sexual activity: Not Currently   Other Topics Concern  . Not on file   Social History Narrative  . No narrative on file    No family history on file.  Past Surgical History:  Procedure Laterality Date  . EYE SURGERY    . TUBAL LIGATION  1983    ROS: Review of Systems Negative except as stated above  PHYSICAL EXAM: BP (!) 159/91   Pulse (!) 101   Temp 98.3 F (36.8 C) (Oral)   Resp 16   Wt 229 lb 6.4 oz (104.1 kg)   SpO2 98%   BMI 40.00 kg/m   Repeat 150/94 Wt Readings from Last 3 Encounters:  10/04/16 229 lb 6.4 oz (104.1 kg)  08/31/16 235 lb 12.8 oz (107 kg)  04/07/15 232 lb (105.2 kg)   Physical Exam General appearance - alert, well appearing, and in no distress Mental status - alert, oriented to person, place, and time, normal mood, behavior, speech, dress, motor activity, and thought processes Neck - supple, no significant adenopathy Chest - clear to auscultation, no wheezes, rales or rhonchi, symmetric  air entry Heart - normal rate, regular rhythm, normal S1, S2, no murmurs, rubs, clicks or gallops Extremities - peripheral pulses normal, no pedal edema, no clubbing or cyanosis  ASSESSMENT AND PLAN: 1. Essential hypertension -improved but not at goal. Advise use of arm rather than wrist cuff for home BP checks.  She will try to get one.  She is still not interested in BP lower med. Would like to have her f/u again in 1 mth but pt requested longer since she does not have transportation; has to take cabs. Will have her f/u in 2 mths -continue DASH  2. Class 3 obesity with serious comorbidity and body mass index (BMI) of 40.0 to 44.9 in adult, unspecified obesity type [facility]) -commended her on wgh loss so far. Keep up  the good works with healthy eating and regular exercise  3. Colon cancer screening Pt is agreeable to doing FIT test but does not want colonoscopy. - Fecal occult blood, imunochemical  4. Primary osteoarthritis of right knee -continue exercise as toelrated.  CMA gave her info on how to schedule the bone density study.   Patient was given the opportunity to ask questions.  Patient verbalized understanding of the plan and was able to repeat key elements of the plan.   Orders Placed This Encounter  Procedures  . Fecal occult blood, imunochemical     Requested Prescriptions    No prescriptions requested or ordered in this encounter    Return in about 2 months (around 12/04/2016).  Karle Plumber, MD, FACP

## 2016-10-04 NOTE — Patient Instructions (Signed)
Continue to check Blood pressure 2-3 times a week.  Try to get the automated arm cuff.   Continue walking 2-3 times a week for exercise. Please use and return the Cologuard stool test.

## 2016-10-22 ENCOUNTER — Other Ambulatory Visit: Payer: Self-pay | Admitting: Internal Medicine

## 2016-10-22 DIAGNOSIS — E2839 Other primary ovarian failure: Secondary | ICD-10-CM

## 2016-11-12 IMAGING — CR DG KNEE COMPLETE 4+V*R*
4 series · 4 of 4 positions shown · non-contrast
Comparison: None.

CLINICAL DATA: Chronic right knee pain

EXAM:
RIGHT KNEE - COMPLETE 4+ VIEW

[AP]
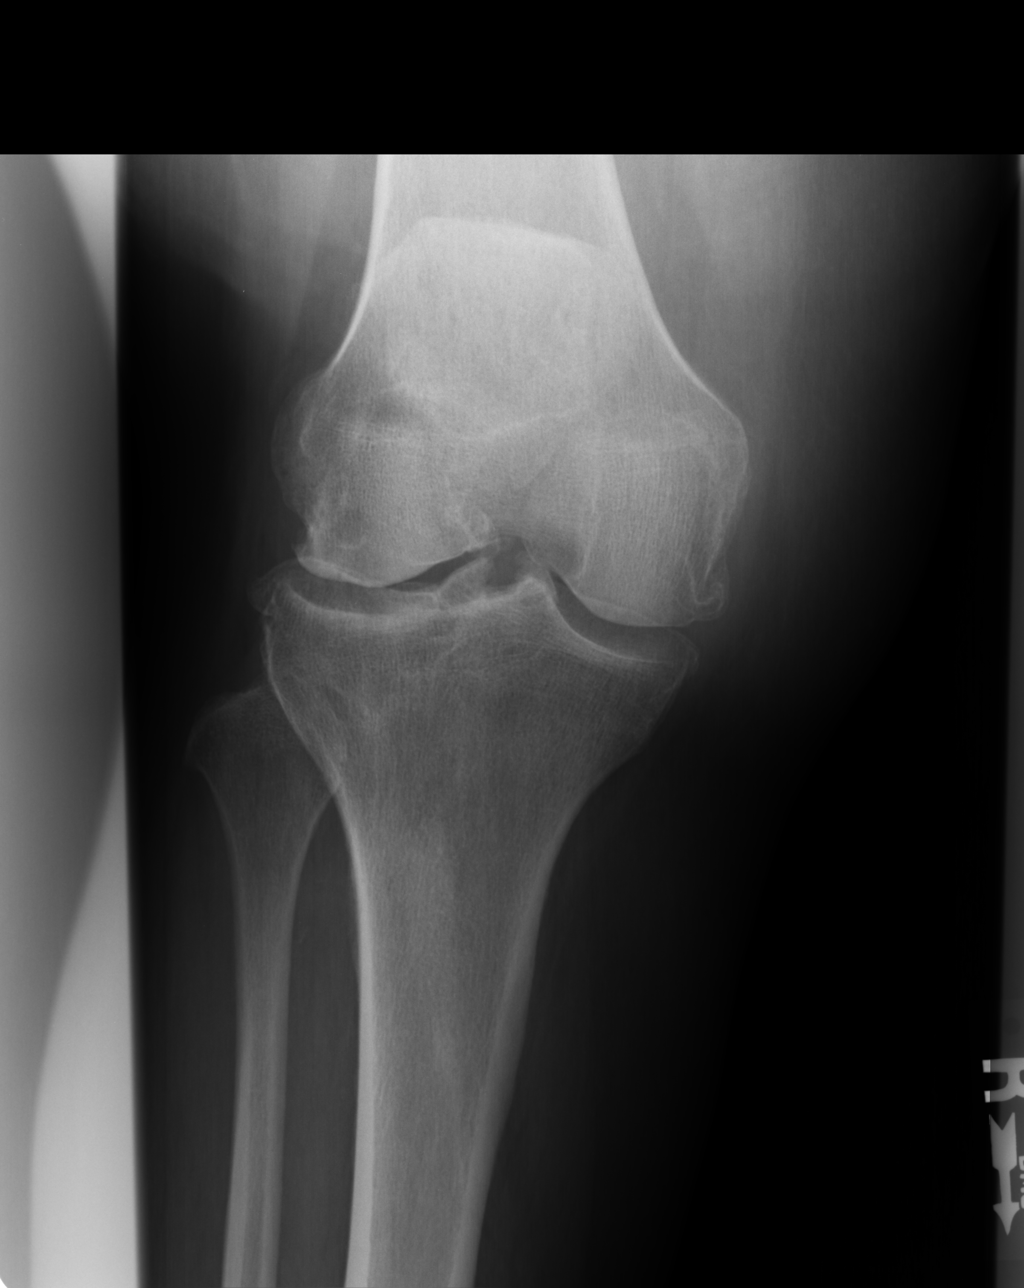

[lateral]
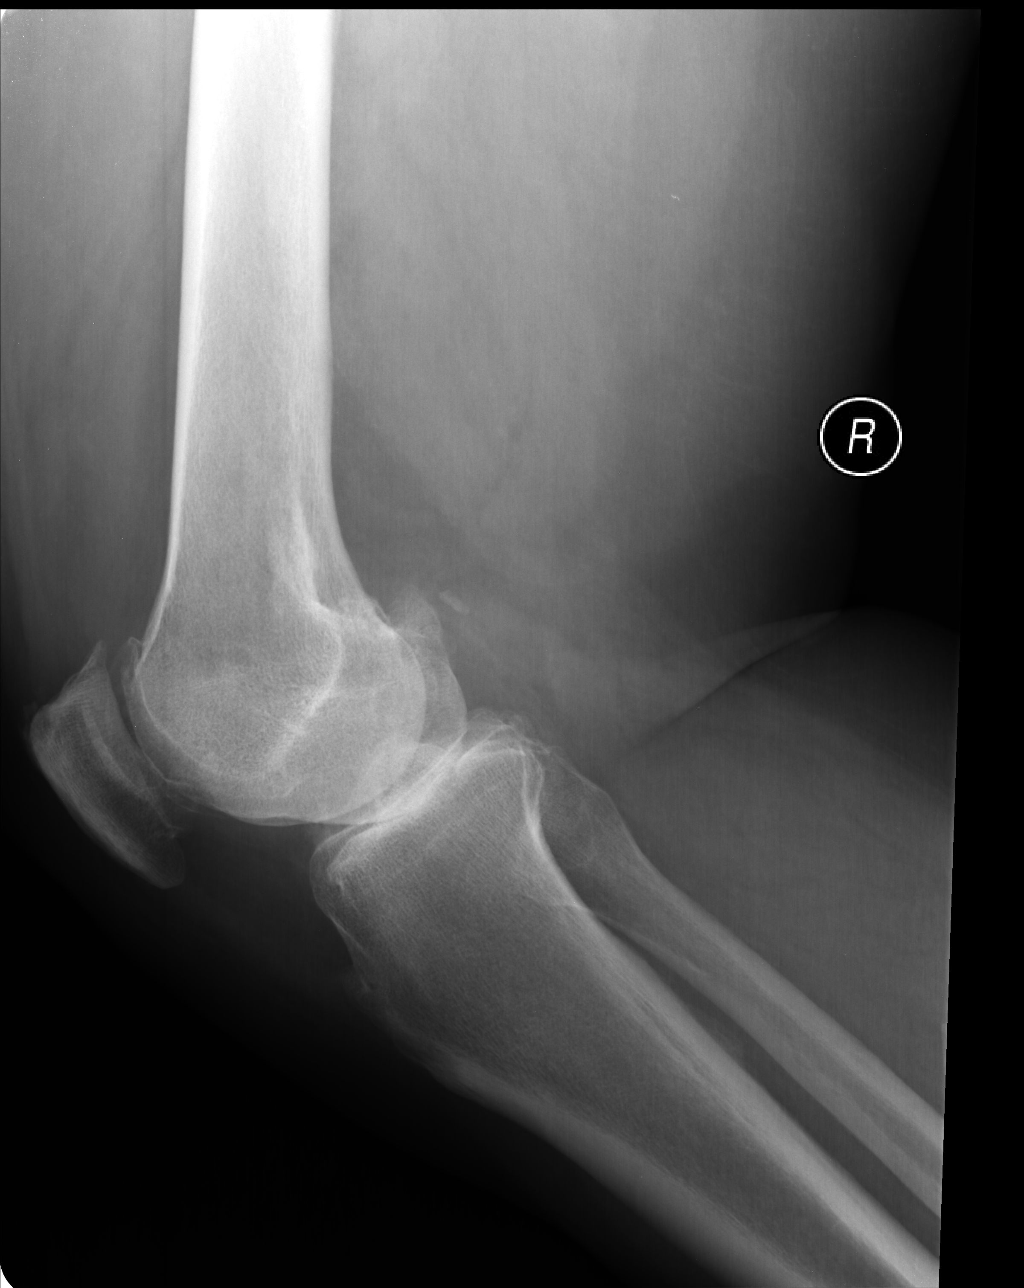

[sunrise]
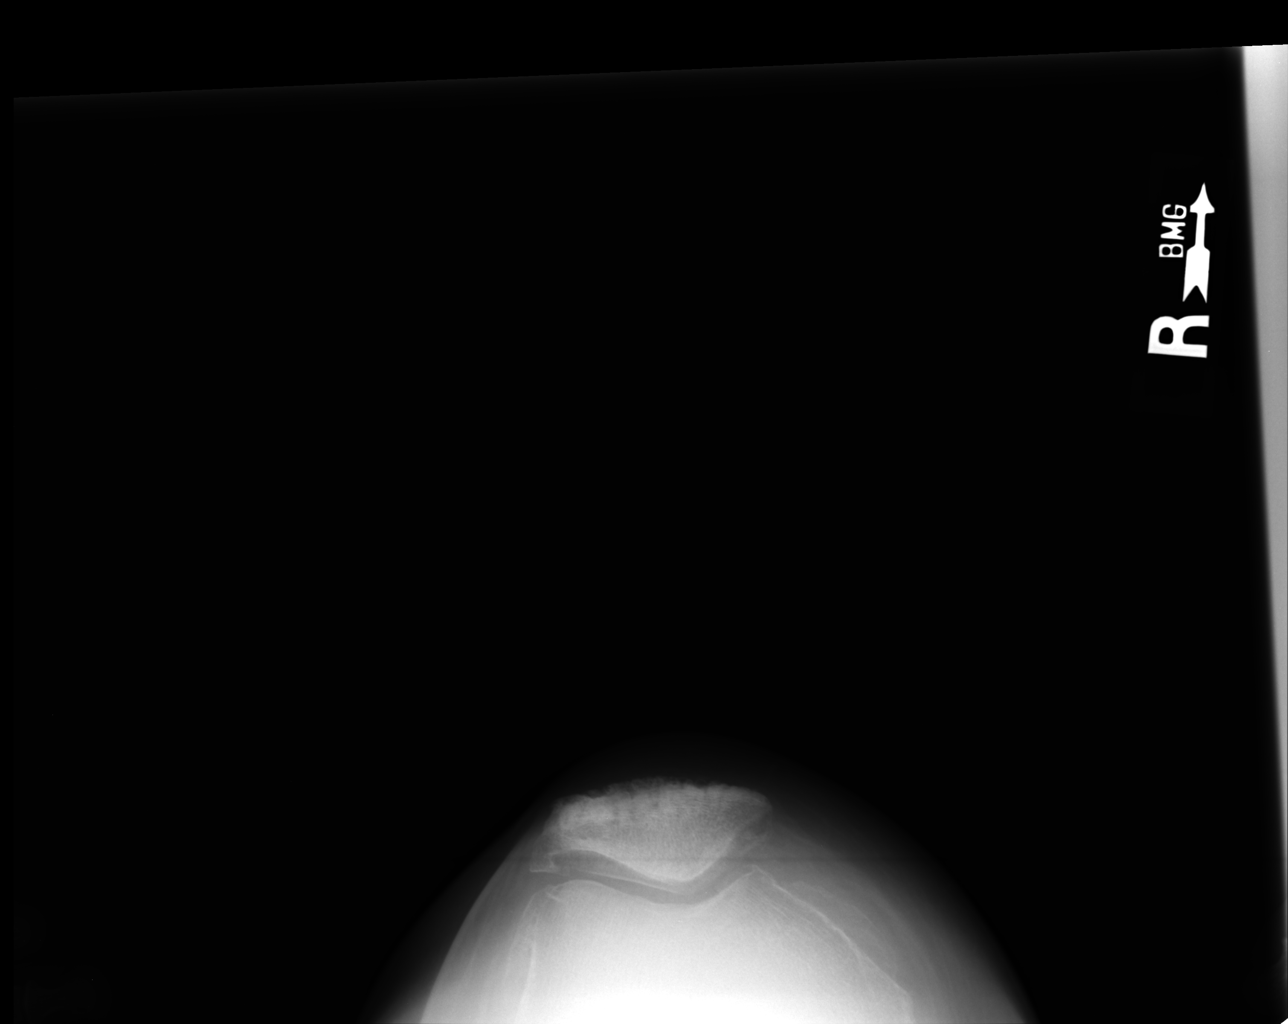

[ap axial]
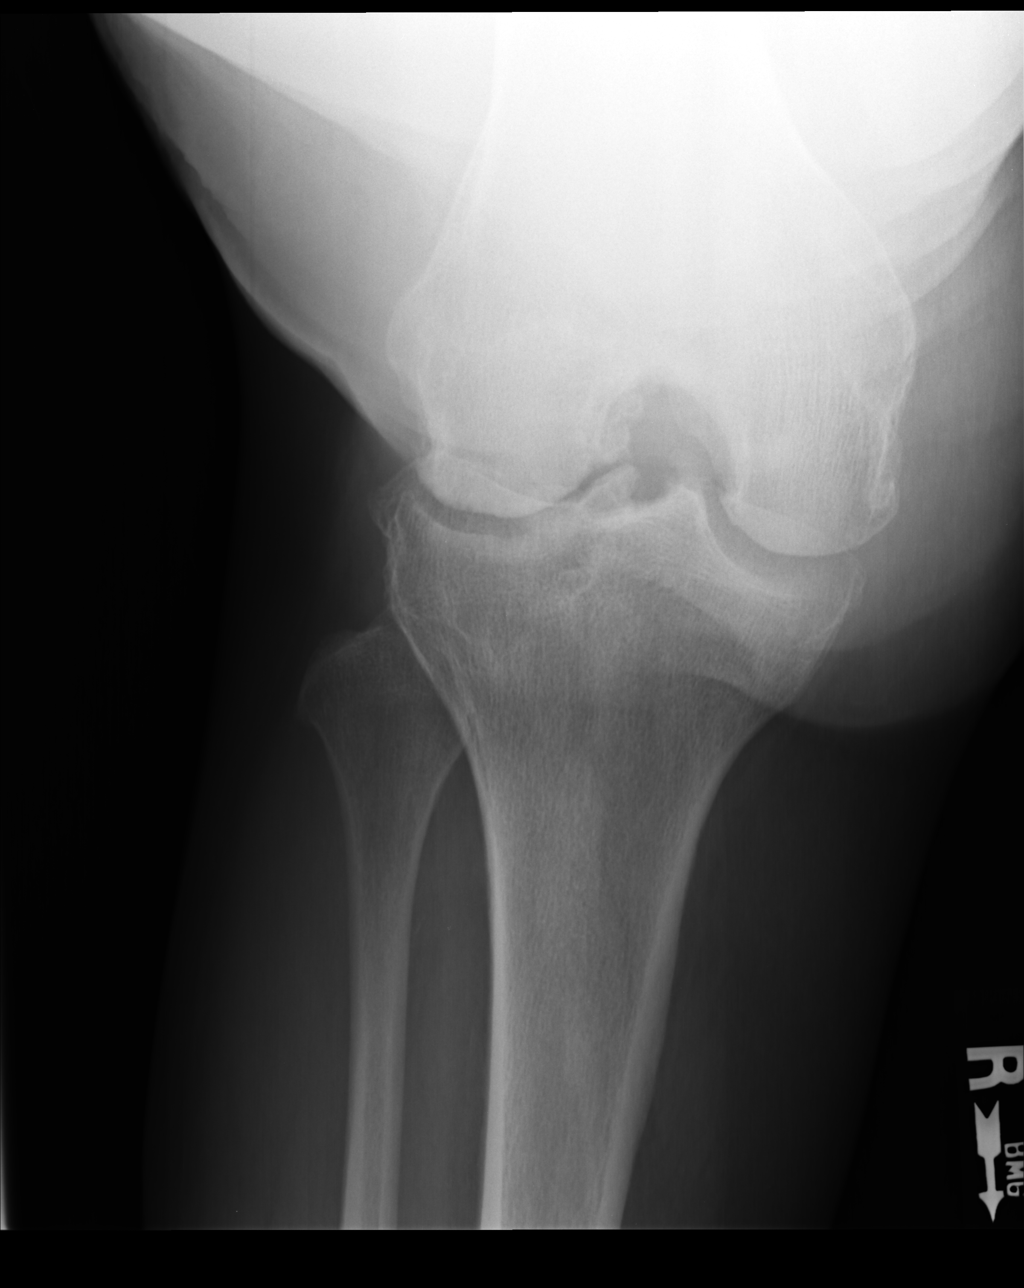

[4 of 4 positions shown; findings below may reference images not displayed]

FINDINGS: The bones of the knee are adequately mineralized. There is beaking
of the tibial spines. There are marginal osteophytes from the
femoral condyles and periphery of the tibial plateaus. There are
spurs from the superior and inferior articular margins of the
patella as well as arising from the patellar notch of the distal
femur. No definite joint effusion is demonstrated.
IMPRESSION: There is mild-to-moderate degenerative change involving all 3 joint
compartments of the right knee.

## 2017-09-06 ENCOUNTER — Ambulatory Visit: Payer: Medicare Other | Attending: Internal Medicine | Admitting: Internal Medicine

## 2017-09-06 ENCOUNTER — Encounter: Payer: Self-pay | Admitting: Internal Medicine

## 2017-09-06 VITALS — BP 165/85 | HR 94 | Temp 98.2°F | Resp 18 | Ht 63.5 in | Wt 217.0 lb

## 2017-09-06 DIAGNOSIS — Z0001 Encounter for general adult medical examination with abnormal findings: Secondary | ICD-10-CM | POA: Diagnosis not present

## 2017-09-06 DIAGNOSIS — Z1239 Encounter for other screening for malignant neoplasm of breast: Secondary | ICD-10-CM

## 2017-09-06 DIAGNOSIS — Z6837 Body mass index (BMI) 37.0-37.9, adult: Secondary | ICD-10-CM | POA: Diagnosis not present

## 2017-09-06 DIAGNOSIS — Z1211 Encounter for screening for malignant neoplasm of colon: Secondary | ICD-10-CM | POA: Diagnosis not present

## 2017-09-06 DIAGNOSIS — Z2821 Immunization not carried out because of patient refusal: Secondary | ICD-10-CM

## 2017-09-06 DIAGNOSIS — Z780 Asymptomatic menopausal state: Secondary | ICD-10-CM | POA: Insufficient documentation

## 2017-09-06 DIAGNOSIS — I10 Essential (primary) hypertension: Secondary | ICD-10-CM | POA: Diagnosis not present

## 2017-09-06 DIAGNOSIS — M1711 Unilateral primary osteoarthritis, right knee: Secondary | ICD-10-CM | POA: Diagnosis not present

## 2017-09-06 DIAGNOSIS — Z1231 Encounter for screening mammogram for malignant neoplasm of breast: Secondary | ICD-10-CM

## 2017-09-06 DIAGNOSIS — R030 Elevated blood-pressure reading, without diagnosis of hypertension: Secondary | ICD-10-CM | POA: Diagnosis not present

## 2017-09-06 DIAGNOSIS — Z0000 Encounter for general adult medical examination without abnormal findings: Secondary | ICD-10-CM

## 2017-09-06 NOTE — Patient Instructions (Signed)
Check your blood pressure twice a week.  Goal is 130/80 or lower.  Please follow up if blood pressure runs higher.

## 2017-09-06 NOTE — Progress Notes (Signed)
Patient ID: Tara Hernandez, female    DOB: [birth year]  MRN: [id number]  CC: Annual Exam   Subjective: Tara Hernandez is a 72 y.o. female who presents for annual exam Her concerns today include:  HTN, obesity and OA RT knee  Health maintenance: Patient was given a fit test on last visit.  Patient states that she did mail it in but we never received the results of it.  She is due for mammogram and bone density study.  She declines pneumonia vaccine.  Wants thyroid check.  Daughter has Graves ds.   No hot/cold intolerance No palpitations  HTN: Blood pressure has been elevated on previous visits.  I had recommend starting blood pressure medicine in the past but she declines.  She a blood pressure device at home and has been checking her blood pressure every other day for the past week.  She gives a range of 120-130s/60-70.  She actually bought the device with her today for me to look through the memory of it but somehow the recordings were erased accidentally.  She thinks that the button was probably pushed accidentally when it was put into her bag to bring in today.   No CP/SOB/LE edema Limits salt in foods  Obesity: Does squats and yoga every day. She use to walk around her building outside but too hot at this time Eating habits - cut back on eating bread; now eats more veggies.  Meats mainly fish and chicken.  Loss 12 lbs since last visit  OA RT knee:  Pain when she does a lot of walking like shopping.  Requesting to get a handicap sticker.  Some pains occasionally in the fingers for which she takes Aleve.  Patient Active Problem List   Diagnosis Date Noted  . Vitamin D deficiency 03/25/2014  . Encounter for screening mammogram for breast cancer 03/25/2014  . Essential hypertension 03/25/2014  . Primary osteoarthritis of right knee 03/25/2014  . Osteoarthritis of right knee 03/08/2014     Current Outpatient Medications on File Prior to Visit  Medication Sig Dispense Refill  .  naproxen sodium (ALEVE) 220 MG tablet Take 220 mg by mouth daily as needed.     No current facility-administered medications on file prior to visit.     No Known Allergies  Social History   Socioeconomic History  . Marital status: Divorced    Spouse name: Not on file  . Number of children: Not on file  . Years of education: Not on file  . Highest education level: Not on file  Occupational History  . Not on file  Social Needs  . Financial resource strain: Not on file  . Food insecurity:    Worry: Not on file    Inability: Not on file  . Transportation needs:    Medical: Not on file    Non-medical: Not on file  Tobacco Use  . Smoking status: Never Smoker  . Smokeless tobacco: Never Used  Substance and Sexual Activity  . Alcohol use: No  . Drug use: No  . Sexual activity: Not Currently  Lifestyle  . Physical activity:    Days per week: Not on file    Minutes per session: Not on file  . Stress: Not on file  Relationships  . Social connections:    Talks on phone: Not on file    Gets together: Not on file    Attends religious service: Not on file    Active member of club or organization:  Not on file    Attends meetings of clubs or organizations: Not on file    Relationship status: Not on file  . Intimate partner violence:    Fear of current or ex partner: Not on file    Emotionally abused: Not on file    Physically abused: Not on file    Forced sexual activity: Not on file  Other Topics Concern  . Not on file  Social History Narrative  . Not on file    History reviewed. No pertinent family history.  Past Surgical History:  Procedure Laterality Date  . EYE SURGERY    . TUBAL LIGATION  1983    ROS: Review of Systems  Eyes: Negative for visual disturbance.       Over due for eye exam. Loss rxn reading glasses  Respiratory: Negative for chest tightness and shortness of breath.   Cardiovascular: Negative for chest pain, palpitations and leg swelling.    Gastrointestinal: Negative for blood in stool.  Genitourinary: Negative for difficulty urinating and hematuria.  Musculoskeletal: Positive for arthralgias (RT knee pain when she does a lot of walking.  Takes Aleve occasionally).       Pain in hands occasionally.  No prolong morning stiffness  Skin: Negative for rash.    PHYSICAL EXAM: BP (!) 165/85 (BP Location: Left Arm, Patient Position: Sitting, Cuff Size: Large)   Pulse 94   Temp 98.2 F (36.8 C) (Oral)   Resp 18   Ht 5' 3.5" (1.613 m)   Wt 217 lb (98.4 kg)   SpO2 98%   BMI 37.84 kg/m   Wt Readings from Last 3 Encounters:  09/06/17 217 lb (98.4 kg)  10/04/16 229 lb 6.4 oz (104.1 kg)  08/31/16 235 lb 12.8 oz (107 kg)    Physical Exam  General appearance - alert, well appearing, and in no distress Mental status - normal mood, behavior, speech, dress, motor activity, and thought processes Eyes - pupils equal and reactive, extraocular eye movements intact Ears - bilateral TM's and external ear canals normal Nose - normal and patent, no erythema, discharge or polyps Mouth - mucous membranes moist, pharynx normal without lesions Neck - supple, no significant adenopathy Lymphatics - no palpable lymphadenopathy, no hepatosplenomegaly Chest - clear to auscultation, no wheezes, rales or rhonchi, symmetric air entry Heart - normal rate, regular rhythm, normal S1, S2, no murmurs, rubs, clicks or gallops Abdomen - soft, nontender, nondistended, no masses or organomegaly Breasts - breasts appear normal, no suspicious masses, no skin or nipple changes or axillary nodes Neurological - cranial nerves II through XII intact, normal muscle tone, no tremors, strength 5/5.  Gait is normal Musculoskeletal -knees: Mild joint enlargement bilaterally.  Right knee: No point tenderness.  Mild crepitus laterally with passive range of motion. Mild to moderate enlargement of DIP joints on both hands. Protruding bony overgrowth on the lateral aspect  of both feet which he states she has had for years Extremities - peripheral pulses normal, no pedal edema, no clubbing or cyanosis  ASSESSMENT AND PLAN: 1. Annual physical exam -We will get her up-to-date with age-appropriate health maintenance  2. Elevated blood pressure reading Blood pressure elevated today.  However she reports good BP readings with upper arm cuff home monitor device.  Went over blood pressure goal with her of 130/80 or lower.  Advised her to check blood pressure twice a week and bring in readings on next visit  3. Primary osteoarthritis of right knee Patient takes Aleve as needed.  Form completed for handicap sticker  4. Class 2 severe obesity with serious comorbidity and body mass index (BMI) of 37.0 to 37.9 in adult, unspecified obesity type ([place]) Commended her on weight loss.  Encouraged her to continue healthy eating habits and trying to get in some form of regular activity 3 times a week for 20 to 30 minutes - CBC - Comprehensive metabolic panel - Lipid panel  5. Breast cancer screening - MM Digital Screening; Future  6. Colon cancer screening - Fecal occult blood, imunochemical([facility])  7. Postmenopausal state - DG Bone Density; Future  8. Pneumococcal vaccination declined by patient    Patient was given the opportunity to ask questions.  Patient verbalized understanding of the plan and was able to repeat key elements of the plan.   Orders Placed This Encounter  Procedures  . Fecal occult blood, imunochemical([facility])  . MM Digital Screening  . DG Bone Density  . CBC  . Comprehensive metabolic panel  . Lipid panel     Requested Prescriptions    No prescriptions requested or ordered in this encounter    Return in about 3 months (around 12/07/2017).  Karle Plumber, MD, FACP

## 2017-09-07 ENCOUNTER — Emergency Department (HOSPITAL_COMMUNITY): Payer: Medicare Other

## 2017-09-07 ENCOUNTER — Encounter (HOSPITAL_COMMUNITY): Payer: Self-pay | Admitting: Emergency Medicine

## 2017-09-07 ENCOUNTER — Emergency Department (HOSPITAL_COMMUNITY)
Admission: EM | Admit: 2017-09-07 | Discharge: 2017-09-07 | Disposition: A | Discharge status: Home / Self Care | Payer: Medicare Other | Attending: Emergency Medicine | Admitting: Emergency Medicine

## 2017-09-07 ENCOUNTER — Other Ambulatory Visit: Payer: Self-pay | Admitting: Internal Medicine

## 2017-09-07 ENCOUNTER — Other Ambulatory Visit: Payer: Self-pay

## 2017-09-07 DIAGNOSIS — S60221A Contusion of right hand, initial encounter: Secondary | ICD-10-CM | POA: Insufficient documentation

## 2017-09-07 DIAGNOSIS — I10 Essential (primary) hypertension: Secondary | ICD-10-CM | POA: Insufficient documentation

## 2017-09-07 DIAGNOSIS — M7989 Other specified soft tissue disorders: Secondary | ICD-10-CM | POA: Diagnosis not present

## 2017-09-07 DIAGNOSIS — S0993XA Unspecified injury of face, initial encounter: Secondary | ICD-10-CM | POA: Diagnosis not present

## 2017-09-07 DIAGNOSIS — Y9301 Activity, walking, marching and hiking: Secondary | ICD-10-CM | POA: Insufficient documentation

## 2017-09-07 DIAGNOSIS — M25531 Pain in right wrist: Secondary | ICD-10-CM | POA: Diagnosis not present

## 2017-09-07 DIAGNOSIS — S0081XA Abrasion of other part of head, initial encounter: Secondary | ICD-10-CM | POA: Diagnosis not present

## 2017-09-07 DIAGNOSIS — M79643 Pain in unspecified hand: Secondary | ICD-10-CM | POA: Diagnosis not present

## 2017-09-07 DIAGNOSIS — W19XXXA Unspecified fall, initial encounter: Secondary | ICD-10-CM | POA: Diagnosis not present

## 2017-09-07 DIAGNOSIS — S0990XA Unspecified injury of head, initial encounter: Secondary | ICD-10-CM | POA: Diagnosis not present

## 2017-09-07 DIAGNOSIS — S0083XA Contusion of other part of head, initial encounter: Secondary | ICD-10-CM | POA: Insufficient documentation

## 2017-09-07 DIAGNOSIS — S199XXA Unspecified injury of neck, initial encounter: Secondary | ICD-10-CM | POA: Diagnosis not present

## 2017-09-07 DIAGNOSIS — Y92481 Parking lot as the place of occurrence of the external cause: Secondary | ICD-10-CM | POA: Diagnosis not present

## 2017-09-07 DIAGNOSIS — S6991XA Unspecified injury of right wrist, hand and finger(s), initial encounter: Secondary | ICD-10-CM | POA: Diagnosis not present

## 2017-09-07 DIAGNOSIS — Y999 Unspecified external cause status: Secondary | ICD-10-CM | POA: Diagnosis not present

## 2017-09-07 DIAGNOSIS — W109XXA Fall (on) (from) unspecified stairs and steps, initial encounter: Secondary | ICD-10-CM | POA: Diagnosis not present

## 2017-09-07 DIAGNOSIS — R42 Dizziness and giddiness: Secondary | ICD-10-CM | POA: Diagnosis not present

## 2017-09-07 DIAGNOSIS — T148XXA Other injury of unspecified body region, initial encounter: Secondary | ICD-10-CM

## 2017-09-07 DIAGNOSIS — I959 Hypotension, unspecified: Secondary | ICD-10-CM | POA: Diagnosis not present

## 2017-09-07 LAB — COMPREHENSIVE METABOLIC PANEL
ALT: 11 IU/L (ref 0–32)
AST: 22 IU/L (ref 0–40)
Albumin/Globulin Ratio: 1.5 (ref 1.2–2.2)
Albumin: 4.5 g/dL (ref 3.5–4.8)
Alkaline Phosphatase: 59 IU/L (ref 39–117)
BILIRUBIN TOTAL: 0.5 mg/dL (ref 0.0–1.2)
BUN/Creatinine Ratio: 19 (ref 12–28)
BUN: 17 mg/dL (ref 8–27)
CHLORIDE: 106 mmol/L (ref 96–106)
CO2: 21 mmol/L (ref 20–29)
Calcium: 10.1 mg/dL (ref 8.7–10.3)
Creatinine, Ser: 0.88 mg/dL (ref 0.57–1.00)
GFR calc Af Amer: 76 mL/min/{1.73_m2} (ref >59)
GFR calc non Af Amer: 66 mL/min/{1.73_m2} (ref >59)
GLUCOSE: 83 mg/dL (ref 65–99)
Globulin, Total: 3 g/dL (ref 1.5–4.5)
POTASSIUM: 4 mmol/L (ref 3.5–5.2)
Sodium: 144 mmol/L (ref 134–144)
TOTAL PROTEIN: 7.5 g/dL (ref 6.0–8.5)

## 2017-09-07 LAB — LIPID PANEL
CHOLESTEROL TOTAL: 211 mg/dL — AB (ref 100–199)
Chol/HDL Ratio: 3.5 ratio (ref 0.0–4.4)
HDL: 60 mg/dL (ref >39)
LDL Calculated: 135 mg/dL — ABNORMAL HIGH (ref 0–99)
TRIGLYCERIDES: 81 mg/dL (ref 0–149)
VLDL CHOLESTEROL CAL: 16 mg/dL (ref 5–40)

## 2017-09-07 LAB — CBC
Hematocrit: 40.5 % (ref 34.0–46.6)
Hemoglobin: 13.3 g/dL (ref 11.1–15.9)
MCH: 30.2 pg (ref 26.6–33.0)
MCHC: 32.8 g/dL (ref 31.5–35.7)
MCV: 92 fL (ref 79–97)
PLATELETS: 189 10*3/uL (ref 150–450)
RBC: 4.4 x10E6/uL (ref 3.77–5.28)
RDW: 13.2 % (ref 12.3–15.4)
WBC: 5.9 10*3/uL (ref 3.4–10.8)

## 2017-09-07 MED ORDER — PRAVASTATIN SODIUM 20 MG PO TABS: 20.0000 mg | ORAL_TABLET | Freq: Every day | ORAL | 3 refills | Status: DC

## 2017-09-07 MED ORDER — KETOROLAC TROMETHAMINE 60 MG/2ML IM SOLN
15.0000 mg | Freq: Once | INTRAMUSCULAR | Status: AC
  Administered 2017-09-07: 15 mg via INTRAMUSCULAR
  Filled 2017-09-07: qty 2

## 2017-09-07 MED ORDER — KETOROLAC TROMETHAMINE 15 MG/ML IJ SOLN: 15.0000 mg | Freq: Once | INTRAMUSCULAR | Status: DC

## 2017-09-07 NOTE — ED Triage Notes (Signed)
To ED via [facility] - pt fell in parking lot, no dizziness, no loss of consciousness, pt states "I just tripped"  Pt has swelling to right cheek , right hand,  Abrasions to right cheek, upper lip, right hand. Has cracked from left tooth.

## 2017-09-07 NOTE — ED Notes (Signed)
D/c reviewed with patient 

## 2017-09-07 NOTE — ED Provider Notes (Signed)
[facility] EMERGENCY DEPARTMENT Provider Note   CSN: [id number] Arrival date & time: 09/07/17  1120     History   Chief Complaint Chief Complaint  Patient presents with  . Fall    HPI Shavona Gunderman is a 72 y.o. female with a history of HTN and osteoarthritis who presents after a mechanical fall. Patient reports that she tripped over a step and fell in a parking lot. She reports hitting the right side of her face as well as her right hand. EMS was called and she was transported to the ED.   She denies LOC, dizziness, light-headedness, chest pain, and SOB.  Past Medical History:  Diagnosis Date  . Gout     Patient Active Problem List   Diagnosis Date Noted  . Vitamin D deficiency 03/25/2014  . Encounter for screening mammogram for breast cancer 03/25/2014  . Essential hypertension 03/25/2014  . Primary osteoarthritis of right knee 03/25/2014  . Osteoarthritis of right knee 03/08/2014    Past Surgical History:  Procedure Laterality Date  . EYE SURGERY    . TUBAL LIGATION  1983     OB History    Gravida  7   Para  7   Term  7   Preterm      AB      Living  7     SAB      TAB      Ectopic      Multiple      Live Births  7            Home Medications    Prior to Admission medications   Medication Sig Start Date End Date Taking? Authorizing Provider  naproxen sodium (ALEVE) 220 MG tablet Take 220 mg by mouth daily as needed.   Yes [provider]    Family History No family history on file.  Social History Social History   Tobacco Use  . Smoking status: Never Smoker  . Smokeless tobacco: Never Used  Substance Use Topics  . Alcohol use: No  . Drug use: No     Allergies   Patient has no known allergies.   Review of Systems Review of Systems  Constitutional: Negative for activity change and fever.  HENT: Negative for congestion.   Respiratory: Negative for cough and shortness of breath.     Cardiovascular: Negative for chest pain.  Gastrointestinal: Negative for abdominal distention, abdominal pain, constipation, diarrhea, nausea and vomiting.  Genitourinary: Negative for dysuria, hematuria and urgency.  Musculoskeletal:       Right wrist pain. 3rd and 4th digit pain of right hand.   Neurological: Negative for dizziness, light-headedness and headaches.  All other systems reviewed and are negative.   Physical Exam Updated Vital Signs BP (!) 152/74   Pulse 72   Resp 18   Ht 5' 3.5" (1.613 m)   Wt 98.4 kg (217 lb)   SpO2 100%   BMI 37.84 kg/m   Physical Exam  Constitutional: She is oriented to person, place, and time.  HENT:  Head: Normocephalic. Head is without contusion.    Eyes: Pupils are equal, round, and reactive to light. EOM are normal.  Neck: Normal range of motion.  Cardiovascular: Normal rate, regular rhythm, normal heart sounds and intact distal pulses.  Pulmonary/Chest: Effort normal and breath sounds normal.  Abdominal: Soft. Bowel sounds are normal.  Musculoskeletal:  Tenderness to palpation of the right maxilla. Tenderness to palpation of the 3rd and 4th  metacarpal bones on the right hand. No tenderness to palpation of the right wrist. Normal range of motion of the right digits and wrist.   Neurological: She is alert and oriented to person, place, and time.  Skin: Abrasion noted.     Psychiatric: She has a normal mood and affect. Her behavior is normal.     ED Treatments / Results  Labs (all labs ordered are listed, but only abnormal results are displayed) Labs Reviewed - No data to display  EKG None  Radiology Ct Head Wo Contrast  Result Date: 09/07/2017 CLINICAL DATA:  Dizziness and a fall today. Abrasions about the face. Initial encounter. EXAM: CT HEAD WITHOUT CONTRAST CT MAXILLOFACIAL WITHOUT CONTRAST CT CERVICAL SPINE WITHOUT CONTRAST TECHNIQUE: Multidetector CT imaging of the head, cervical spine, and maxillofacial structures were  performed using the standard protocol without intravenous contrast. Multiplanar CT image reconstructions of the cervical spine and maxillofacial structures were also generated. COMPARISON:  Maxillofacial CT scan 10/31/2008 FINDINGS: CT HEAD FINDINGS Brain: No evidence of acute infarction, hemorrhage, hydrocephalus, extra-axial collection or mass lesion/mass effect. Vascular: Atherosclerosis noted. Skull: Intact.  No focal lesion. Other: None. CT MAXILLOFACIAL FINDINGS Osseous: No fracture or mandibular dislocation. No destructive process. Orbits: No acute or focal abnormality. Status post bilateral lens extraction. Sinuses: Clear. Soft tissues: Soft tissue contusion over the right maxilla is noted. CT CERVICAL SPINE FINDINGS Alignment: Maintained. Skull base and vertebrae: No acute fracture. No primary bone lesion or focal pathologic process. Soft tissues and spinal canal: No prevertebral fluid or swelling. No visible canal hematoma. Disc levels: Loss of disc space height appears worst at C5-6 and C6-7. Upper chest: Lung apices are clear. Calcifications along the right trachea and in the anterior mediastinum on the right are likely calcified lymph nodes secondary to old granulomatous disease. Carotid atherosclerosis noted. Other: None. IMPRESSION: Soft tissue contusions on the right side of the face. No other acute abnormality head, face or cervical spine. 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 Atherosclerosis. Electronically Signed   By: Inge Rise M.D.   On: 09/07/2017 13:19   Ct Cervical Spine Wo Contrast  Result Date: 09/07/2017 CLINICAL DATA:  Dizziness and a fall today. Abrasions about the face. Initial encounter. EXAM: CT HEAD WITHOUT CONTRAST CT MAXILLOFACIAL WITHOUT CONTRAST CT CERVICAL SPINE WITHOUT CONTRAST TECHNIQUE: Multidetector CT imaging of the head, cervical spine, and maxillofacial structures were performed using the standard protocol without intravenous contrast. Multiplanar CT image  reconstructions of the cervical spine and maxillofacial structures were also generated. COMPARISON:  Maxillofacial CT scan 10/31/2008 FINDINGS: CT HEAD FINDINGS Brain: No evidence of acute infarction, hemorrhage, hydrocephalus, extra-axial collection or mass lesion/mass effect. Vascular: Atherosclerosis noted. Skull: Intact.  No focal lesion. Other: None. CT MAXILLOFACIAL FINDINGS Osseous: No fracture or mandibular dislocation. No destructive process. Orbits: No acute or focal abnormality. Status post bilateral lens extraction. Sinuses: Clear. Soft tissues: Soft tissue contusion over the right maxilla is noted. CT CERVICAL SPINE FINDINGS Alignment: Maintained. Skull base and vertebrae: No acute fracture. No primary bone lesion or focal pathologic process. Soft tissues and spinal canal: No prevertebral fluid or swelling. No visible canal hematoma. Disc levels: Loss of disc space height appears worst at C5-6 and C6-7. Upper chest: Lung apices are clear. Calcifications along the right trachea and in the anterior mediastinum on the right are likely calcified lymph nodes secondary to old granulomatous disease. Carotid atherosclerosis noted. Other: None. IMPRESSION: Soft tissue contusions on the right side of the face. No other acute abnormality head,  face or cervical spine. 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 Atherosclerosis. Electronically Signed   By: Inge Rise M.D.   On: 09/07/2017 13:19   Dg Hand 2 View Right  Result Date: 09/07/2017 CLINICAL DATA:  Fall, RIGHT hand swelling, RIGHT hand abrasions. EXAM: RIGHT HAND - 2 VIEW COMPARISON:  None. FINDINGS: No fracture or dislocation. Soft tissue swelling at the second and third PIP joints, of uncertain chronicity. Soft tissues about the RIGHT hand are otherwise unremarkable. No radiodense foreign body within the soft tissues. IMPRESSION: 1. No osseous fracture or dislocation. 2. Soft tissue swelling/thickening at the second and third PIP joints, of  uncertain chronicity, possibly related to underlying mild DJD. Electronically Signed   By: Franki Cabot M.D.   On: 09/07/2017 12:17   Ct Maxillofacial Wo Contrast  Result Date: 09/07/2017 CLINICAL DATA:  Dizziness and a fall today. Abrasions about the face. Initial encounter. EXAM: CT HEAD WITHOUT CONTRAST CT MAXILLOFACIAL WITHOUT CONTRAST CT CERVICAL SPINE WITHOUT CONTRAST TECHNIQUE: Multidetector CT imaging of the head, cervical spine, and maxillofacial structures were performed using the standard protocol without intravenous contrast. Multiplanar CT image reconstructions of the cervical spine and maxillofacial structures were also generated. COMPARISON:  Maxillofacial CT scan 10/31/2008 FINDINGS: CT HEAD FINDINGS Brain: No evidence of acute infarction, hemorrhage, hydrocephalus, extra-axial collection or mass lesion/mass effect. Vascular: Atherosclerosis noted. Skull: Intact.  No focal lesion. Other: None. CT MAXILLOFACIAL FINDINGS Osseous: No fracture or mandibular dislocation. No destructive process. Orbits: No acute or focal abnormality. Status post bilateral lens extraction. Sinuses: Clear. Soft tissues: Soft tissue contusion over the right maxilla is noted. CT CERVICAL SPINE FINDINGS Alignment: Maintained. Skull base and vertebrae: No acute fracture. No primary bone lesion or focal pathologic process. Soft tissues and spinal canal: No prevertebral fluid or swelling. No visible canal hematoma. Disc levels: Loss of disc space height appears worst at C5-6 and C6-7. Upper chest: Lung apices are clear. Calcifications along the right trachea and in the anterior mediastinum on the right are likely calcified lymph nodes secondary to old granulomatous disease. Carotid atherosclerosis noted. Other: None. IMPRESSION: Soft tissue contusions on the right side of the face. No other acute abnormality head, face or cervical spine. 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 Atherosclerosis. Electronically Signed   By:  Inge Rise M.D.   On: 09/07/2017 13:19    Procedures Procedures (including critical care time)  Medications Ordered in ED Medications  ketorolac (TORADOL) injection 15 mg (15 mg Intramuscular Given 09/07/17 1228)     Initial Impression / Assessment and Plan / ED Course  I have reviewed the triage vital signs and the nursing notes.  Pertinent labs & imaging results that were available during my care of the patient were reviewed by me and considered in my medical decision making (see chart for details).  Ms.Estabrook is a 72 year old female with a history of HTN and osteoarthritis who presents after a mechanical fall and found to have soft tissue contusions of the face and right hand.   #Facial Contusion - Maxillofacial CT unremarkable: Soft tissue contusions on the right side of the face. No other acute abnormality head, face or cervical spine. - Head CT without contrast unremarkable.  - CT cervical spine unremarkable  #Essntial HTN - Hemodynamically stable - Followed by PCP. Not currently taking antihypertensive medication.  - Recommend following up with PCP.  #Chipped front tooth - Provided a list of local dental providers - Recommend following up with one of these providers within 1-2  weeks.   Final Clinical Impressions(s) / ED Diagnoses   Final diagnoses:  Fall, initial encounter  Contusion of face, initial encounter  Abrasion  Contusion of right hand, initial encounter    ED Discharge Orders    None       Carroll Sage, MD 09/07/17 1633    Carmin Muskrat, MD 09/09/17 2018

## 2017-09-07 NOTE — Discharge Instructions (Addendum)
Please take 2 Tylenol 500 mg tablets as needed for pain every 6 hours. You can also alternate with 2 Ibuprofen 200 mg tablets for pain every 6 hours. Please place ice on your hand and face as needed for pain and swelling.  Please use topical Neosporin on the abrasions of your face and lip.   Please follow-up with one of the dentists on the list we provided.  Follow-up with your primary care provider if hand or face pain worsens.

## 2017-09-07 NOTE — ED Notes (Signed)
Patient transported to CT 

## 2017-09-27 ENCOUNTER — Other Ambulatory Visit: Payer: Self-pay

## 2017-09-27 ENCOUNTER — Telehealth: Payer: Self-pay | Admitting: Internal Medicine

## 2017-09-27 DIAGNOSIS — E2839 Other primary ovarian failure: Secondary | ICD-10-CM

## 2017-09-27 NOTE — Telephone Encounter (Signed)
Returned [place] call and asked her what dx code would medicare accept she provided multiple dx codes I chose E28.39 Estrogen Deficiency. Discontinue first order and entered in new order for [facility] Bone Density with correct dx code and signed order. They will contact pt to schedule

## 2017-09-27 NOTE — Telephone Encounter (Signed)
[place] from the [facility] called stating that the diagnosis code that was used is not covered by the patient insurance. Please f/u

## 2017-09-30 NOTE — Telephone Encounter (Signed)
FYI

## 2017-11-26 ENCOUNTER — Ambulatory Visit
Admission: RE | Admit: 2017-11-26 | Discharge: 2017-11-26 | Disposition: A | Discharge status: Home / Self Care | Payer: Medicare Other | Source: Ambulatory Visit | Attending: Internal Medicine | Admitting: Internal Medicine

## 2017-11-26 DIAGNOSIS — Z780 Asymptomatic menopausal state: Secondary | ICD-10-CM | POA: Diagnosis not present

## 2017-11-26 DIAGNOSIS — Z1239 Encounter for other screening for malignant neoplasm of breast: Secondary | ICD-10-CM

## 2017-11-26 DIAGNOSIS — Z1231 Encounter for screening mammogram for malignant neoplasm of breast: Secondary | ICD-10-CM | POA: Diagnosis not present

## 2017-11-26 DIAGNOSIS — E2839 Other primary ovarian failure: Secondary | ICD-10-CM

## 2019-07-28 ENCOUNTER — Other Ambulatory Visit: Payer: Self-pay

## 2019-07-28 ENCOUNTER — Encounter: Payer: Self-pay | Admitting: Internal Medicine

## 2019-07-28 ENCOUNTER — Ambulatory Visit: Payer: Medicare Other | Attending: Internal Medicine | Admitting: Internal Medicine

## 2019-07-28 VITALS — BP 170/90 | HR 89 | Temp 97.2°F | Resp 16 | Ht 63.5 in | Wt 228.4 lb

## 2019-07-28 DIAGNOSIS — R2243 Localized swelling, mass and lump, lower limb, bilateral: Secondary | ICD-10-CM

## 2019-07-28 DIAGNOSIS — Z0000 Encounter for general adult medical examination without abnormal findings: Secondary | ICD-10-CM | POA: Diagnosis not present

## 2019-07-28 DIAGNOSIS — I10 Essential (primary) hypertension: Secondary | ICD-10-CM | POA: Diagnosis not present

## 2019-07-28 DIAGNOSIS — L0292 Furuncle, unspecified: Secondary | ICD-10-CM

## 2019-07-28 DIAGNOSIS — H5200 Hypermetropia, unspecified eye: Secondary | ICD-10-CM

## 2019-07-28 DIAGNOSIS — L0293 Carbuncle, unspecified: Secondary | ICD-10-CM

## 2019-07-28 DIAGNOSIS — Z6839 Body mass index (BMI) 39.0-39.9, adult: Secondary | ICD-10-CM

## 2019-07-28 DIAGNOSIS — Z2821 Immunization not carried out because of patient refusal: Secondary | ICD-10-CM

## 2019-07-28 DIAGNOSIS — E782 Mixed hyperlipidemia: Secondary | ICD-10-CM

## 2019-07-28 DIAGNOSIS — L509 Urticaria, unspecified: Secondary | ICD-10-CM

## 2019-07-28 DIAGNOSIS — Z1211 Encounter for screening for malignant neoplasm of colon: Secondary | ICD-10-CM

## 2019-07-28 DIAGNOSIS — Z1159 Encounter for screening for other viral diseases: Secondary | ICD-10-CM

## 2019-07-28 DIAGNOSIS — H6123 Impacted cerumen, bilateral: Secondary | ICD-10-CM

## 2019-07-28 MED ORDER — LORATADINE 10 MG PO TABS
10.0000 mg | ORAL_TABLET | Freq: Every day | ORAL | 1 refills | Status: DC
Start: 2019-07-28 — End: 2019-08-13

## 2019-07-28 MED ORDER — MUPIROCIN 2 % EX OINT: TOPICAL_OINTMENT | CUTANEOUS | 1 refills | Status: DC

## 2019-07-28 MED ORDER — MUPIROCIN 2 % EX OINT
TOPICAL_OINTMENT | CUTANEOUS | 1 refills | Status: AC
Start: 2019-07-28 — End: ?

## 2019-07-28 MED ORDER — LORATADINE 10 MG PO TABS: 10.0000 mg | ORAL_TABLET | Freq: Every day | ORAL | 1 refills | Status: DC

## 2019-07-28 MED FILL — MUPIROCIN 2% OINTMENT: 2 | 7 days supply | Qty: 22 | Fill #0

## 2019-07-28 NOTE — Progress Notes (Signed)
Patient ID: Tara Hernandez, female    DOB: 03-29-1945  MRN: [id number]  CC: Annual Exam   Subjective: Tara Hernandez is a 74 y.o. female who presents for chronic ds management Her concerns today include:  HTN, HL, obesity and OA RT knee  HM:  Has not gotten COVID vaccine as yet. Afraid because a friend had it and had what sound like stroke. Declines Prevnar and Tdap Does not want c-scope but will do a Cologuard for colon cancer screening  C/o intermittent boils in vaginal/groin area since last yr. Small boils that go and come.  Not sexually active. Also sometimes gets rash on arms and over shoulder blade. Occur every night.  Different from what she gets in groin area. These ones itch and turn red, looks like hives.  No known food allergies  But does itch if she drinks fruit juices. No bed bugs. Current apartment gets fill of cigarette smoke at nights and she wonders whether this is playing a role  HTN:  Resistant to starting BP meds in past.  Thinks BP high today because she slept only 2 hrs last night. -checks BP Q 3-4 days.  Range 120-140s/70s -limits salt in foods No CP/SOB/LE edema. No HA/dizziness  HL:  Not at goal on last visit.  I recommended starting Pravachol but she never got it fill.  Obesity:  Has cut back on bread and changed to brown rice.  Drink water and coconut.  Not much exercise.  Gained 11 lbs since last visit in 2019.  Patient Active Problem List   Diagnosis Date Noted  . Vitamin D deficiency 03/25/2014  . Encounter for screening mammogram for breast cancer 03/25/2014  . Essential hypertension 03/25/2014  . Primary osteoarthritis of right knee 03/25/2014  . Osteoarthritis of right knee 03/08/2014     Current Outpatient Medications on File Prior to Visit  Medication Sig Dispense Refill  . naproxen sodium (ALEVE) 220 MG tablet Take 220 mg by mouth daily as needed.    . pravastatin (PRAVACHOL) 20 MG tablet Take 1 tablet (20 mg total) by mouth daily. (Patient  not taking: Reported on 07/28/2019) 90 tablet 3   No current facility-administered medications on file prior to visit.    No Known Allergies  Social History   Socioeconomic History  . Marital status: Divorced    Spouse name: Not on file  . Number of children: Not on file  . Years of education: Not on file  . Highest education level: Not on file  Occupational History  . Not on file  Tobacco Use  . Smoking status: Never Smoker  . Smokeless tobacco: Never Used  Substance and Sexual Activity  . Alcohol use: No  . Drug use: No  . Sexual activity: Not Currently  Other Topics Concern  . Not on file  Social History Narrative  . Not on file   Social Determinants of Health   Financial Resource Strain:   . Difficulty of Paying Living Expenses:   Food Insecurity:   . Worried About Charity fundraiser in the Last Year:   . Arboriculturist in the Last Year:   Transportation Needs:   . Film/video editor (Medical):   Marland Kitchen Lack of Transportation (Non-Medical):   Physical Activity:   . Days of Exercise per Week:   . Minutes of Exercise per Session:   Stress:   . Feeling of Stress :   Social Connections:   . Frequency of Communication with Friends  and Family:   . Frequency of Social Gatherings with Friends and Family:   . Attends Religious Services:   . Active Member of Clubs or Organizations:   . Attends Archivist Meetings:   Marland Kitchen Marital Status:   Intimate Partner Violence:   . Fear of Current or Ex-Partner:   . Emotionally Abused:   Marland Kitchen Physically Abused:   . Sexually Abused:     No family history on file.  Past Surgical History:  Procedure Laterality Date  . EYE SURGERY    . TUBAL LIGATION  1983    ROS: Review of Systems  HENT: Negative for congestion and hearing loss.   Eyes: Positive for visual disturbance (problems seeing thinks close up.).  Respiratory: Negative for cough and shortness of breath.   Cardiovascular: Negative for chest pain,  palpitations and leg swelling.  Gastrointestinal: Negative for blood in stool.  Genitourinary: Negative for difficulty urinating.       Burning sometimes with urination.  Not all time.  Last felt that sometime ago.   Menopausal.  Musculoskeletal:       Has hard knot on both feet x 1 yr.  Not painful. Swelling LT wrist yesterday after trying to do some push ups.  Neurological: Negative for dizziness and headaches.  Psychiatric/Behavioral: Negative for dysphoric mood.     PHYSICAL EXAM: BP (!) 171/85   Pulse 89   Temp (!) 97.2 F (36.2 C)   Resp 16   Ht 5' 3.5" (1.613 m)   Wt 228 lb 6.4 oz (103.6 kg)   SpO2 99%   BMI 39.82 kg/m   Wt Readings from Last 3 Encounters:  07/28/19 228 lb 6.4 oz (103.6 kg)  09/07/17 217 lb (98.4 kg)  09/06/17 217 lb (98.4 kg)   Repeat blood pressure 170/90 Physical Exam  General appearance - alert, well appearing, and in no distress Mental status - normal mood, behavior, speech, dress, motor activity, and thought processes Eyes - pupils equal and reactive, extraocular eye movements intact Ears -mild amount of hard wax buildup in the right ear, moderate amount buildup in the left ear obscuring view of the eardrum Nose - normal and patent, no erythema, discharge or polyps Mouth - mucous membranes moist, pharynx normal without lesions Neck - supple, no significant adenopathy, no thyroid enlargement or thyroid nodules Lymphatics - no palpable lymphadenopathy, no hepatosplenomegaly Chest - clear to auscultation, no wheezes, rales or rhonchi, symmetric air entry Heart - normal rate, regular rhythm, normal S1, S2, no murmurs, rubs, clicks or gallops Abdomen -obese, soft, nontender, nondistended, no masses or organomegaly Breasts -CMA Pollock present: Breasts appear normal, no suspicious masses, no skin or nipple changes or axillary nodes Musculoskeletal -left wrist: Mild swelling of the ulnar process.  Firm fixed masses/hypertrophy of the bone on both  midfoot lateral aspect Extremities - peripheral pulses normal, no pedal edema, no clubbing or cyanosis Skin -nickel sized raised mildly erythematous area over the right scapula and to noted over the left scapula CMA Pollock present: No boils noted at this time in the groin or vaginal area  Depression screen [facility] 2/9 07/28/2019 09/06/2017 10/04/2016  Decreased Interest 0 0 0  Down, Depressed, Hopeless 0 0 0  PHQ - 2 Score 0 0 0    CMP Latest Ref Rng & Units 09/06/2017 08/31/2016 04/07/2015  Glucose 65 - 99 mg/dL 83 95 91  BUN 8 - 27 mg/dL 17 11 8   Creatinine 0.57 - 1.00 mg/dL 0.88 0.83 0.75  Sodium 134 -  144 mmol/L 144 143 140  Potassium 3.5 - 5.2 mmol/L 4.0 4.1 4.3  Chloride 96 - 106 mmol/L 106 104 105  CO2 20 - 29 mmol/L 21 23 25   Calcium 8.7 - 10.3 mg/dL 10.1 9.8 9.2  Total Protein 6.0 - 8.5 g/dL 7.5 7.2 7.4  Total Bilirubin 0.0 - 1.2 mg/dL 0.5 0.5 0.8  Alkaline Phos 39 - 117 IU/L 59 65 55  AST 0 - 40 IU/L 22 22 20   ALT 0 - 32 IU/L 11 11 12    Lipid Panel     Component Value Date/Time   CHOL 211 (H) 09/06/2017 1551   TRIG 81 09/06/2017 1551   HDL 60 09/06/2017 1551   CHOLHDL 3.5 09/06/2017 1551   CHOLHDL 4.0 04/07/2015 1611   VLDL 20 04/07/2015 1611   LDLCALC 135 (H) 09/06/2017 1551    CBC    Component Value Date/Time   WBC 5.9 09/06/2017 1551   WBC 5.8 04/07/2015 1611   RBC 4.40 09/06/2017 1551   RBC 4.68 04/07/2015 1611   HGB 13.3 09/06/2017 1551   HCT 40.5 09/06/2017 1551   PLT 189 09/06/2017 1551   MCV 92 09/06/2017 1551   MCH 30.2 09/06/2017 1551   MCH 29.1 04/07/2015 1611   MCHC 32.8 09/06/2017 1551   MCHC 33.4 04/07/2015 1611   RDW 13.2 09/06/2017 1551   LYMPHSABS 2.1 04/07/2015 1611   MONOABS 0.3 04/07/2015 1611   EOSABS 0.1 04/07/2015 1611   BASOSABS 0.1 04/07/2015 1611    ASSESSMENT AND PLAN: 1. Annual physical exam Discussed importance of healthy eating habits and regular exercise.  Encouraged her to try to walk daily for 30 minutes.  She has requested  that her vitamin D level be checked.  We discussed health maintenance issues as she is due for.  She declines the vaccines but is willing to do the colon cancer screening. - VITAMIN D 25 Hydroxy (Vit-D Deficiency, Fractures)  2. Essential hypertension I think she has essential hypertension.  Blood pressure has been elevated in the past but she has refused medications.  I recommend starting medication today and then bring her back in 2 weeks to see the clinical pharmacist for recheck.  Patient declines medication but states she will follow-up in 2 weeks with the clinical pharmacist.  If still elevated on that visit, I recommend starting her on amlodipine.  DASH diet discussed and encouraged - Comprehensive metabolic panel - Lipid panel - CBC  3. Mixed hyperlipidemia See #1 above  4. Class 2 severe obesity due to excess calories with serious comorbidity and body mass index (BMI) of 39.0 to 39.9 in adult [facility]) See #1 above  5. Mass of both feet - Ambulatory referral to Podiatry  6. Hives Start Claritin daily - Ambulatory referral to Allergy  7. Bilateral impacted cerumen Recommend purchasing some wax softener over-the-counter and using it for several days.  8. Screening for colon cancer Discussed colon cancer screening.  She is willing to do the Cologuard test - Cologuard  9. Need for hepatitis C screening test   10. Hypermetropia, unspecified laterality - Ambulatory referral to Ophthalmology  11. Pneumococcal vaccination declined by patient  12. Tetanus, diphtheria, and acellular pertussis (Tdap) vaccination declined   52. COVID-19 virus vaccination declined   14. Encounter for hepatitis C screening test for low risk patient - Hepatitis C Antibody  15. Recurrent boils None present at this time.  But most likely due to MRSA.  Discussed good handwashing.  We will give her  some Bactroban ointment to keep on hand to use if needed   Patient was given the opportunity to ask  questions.  Patient verbalized understanding of the plan and was able to repeat key elements of the plan.   No orders of the defined types were placed in this encounter.    Requested Prescriptions    No prescriptions requested or ordered in this encounter    No follow-ups on file.  Karle Plumber, MD, FACP

## 2019-07-28 NOTE — Patient Instructions (Signed)
Please give patient an appointment with the clinical pharmacist in 2 weeks for blood pressure recheck.

## 2019-07-29 LAB — COMPREHENSIVE METABOLIC PANEL
ALT: 7 IU/L (ref 0–32)
AST: 21 IU/L (ref 0–40)
Albumin/Globulin Ratio: 1.6 (ref 1.2–2.2)
Albumin: 4.3 g/dL (ref 3.7–4.7)
Alkaline Phosphatase: 75 IU/L (ref 48–121)
BUN/Creatinine Ratio: 16 (ref 12–28)
BUN: 13 mg/dL (ref 8–27)
Bilirubin Total: 0.5 mg/dL (ref 0.0–1.2)
CO2: 20 mmol/L (ref 20–29)
Calcium: 9.2 mg/dL (ref 8.7–10.3)
Chloride: 107 mmol/L — ABNORMAL HIGH (ref 96–106)
Creatinine, Ser: 0.83 mg/dL (ref 0.57–1.00)
GFR calc Af Amer: 80 mL/min/{1.73_m2} (ref >59)
GFR calc non Af Amer: 70 mL/min/{1.73_m2} (ref >59)
Globulin, Total: 2.7 g/dL (ref 1.5–4.5)
Glucose: 86 mg/dL (ref 65–99)
Potassium: 3.8 mmol/L (ref 3.5–5.2)
Sodium: 144 mmol/L (ref 134–144)
Total Protein: 7 g/dL (ref 6.0–8.5)

## 2019-07-29 LAB — CBC
Hematocrit: 39 % (ref 34.0–46.6)
Hemoglobin: 13.2 g/dL (ref 11.1–15.9)
MCH: 29.7 pg (ref 26.6–33.0)
MCHC: 33.8 g/dL (ref 31.5–35.7)
MCV: 88 fL (ref 79–97)
Platelets: 179 10*3/uL (ref 150–450)
RBC: 4.44 x10E6/uL (ref 3.77–5.28)
RDW: 13.6 % (ref 11.7–15.4)
WBC: 5.1 10*3/uL (ref 3.4–10.8)

## 2019-07-29 LAB — LIPID PANEL
Chol/HDL Ratio: 3.4 ratio (ref 0.0–4.4)
Cholesterol, Total: 193 mg/dL (ref 100–199)
HDL: 57 mg/dL (ref >39)
LDL Chol Calc (NIH): 121 mg/dL — ABNORMAL HIGH (ref 0–99)
Triglycerides: 81 mg/dL (ref 0–149)
VLDL Cholesterol Cal: 15 mg/dL (ref 5–40)

## 2019-07-29 LAB — VITAMIN D 25 HYDROXY (VIT D DEFICIENCY, FRACTURES): Vit D, 25-Hydroxy: 54.7 ng/mL (ref 30.0–100.0)

## 2019-07-29 LAB — HEPATITIS C ANTIBODY: Hep C Virus Ab: <0.1 s/co ratio (ref 0.0–0.9)

## 2019-08-13 ENCOUNTER — Other Ambulatory Visit: Payer: Self-pay

## 2019-08-13 ENCOUNTER — Ambulatory Visit (INDEPENDENT_AMBULATORY_CARE_PROVIDER_SITE_OTHER): Payer: Medicare Other | Admitting: Podiatry

## 2019-08-13 ENCOUNTER — Encounter: Payer: Self-pay | Admitting: Podiatry

## 2019-08-13 ENCOUNTER — Other Ambulatory Visit: Payer: Self-pay | Admitting: Podiatry

## 2019-08-13 ENCOUNTER — Ambulatory Visit (INDEPENDENT_AMBULATORY_CARE_PROVIDER_SITE_OTHER): Payer: Medicare Other

## 2019-08-13 VITALS — Temp 95.8°F

## 2019-08-13 DIAGNOSIS — R2243 Localized swelling, mass and lump, lower limb, bilateral: Secondary | ICD-10-CM

## 2019-08-14 NOTE — Progress Notes (Signed)
Subjective:   Patient ID: Tara Hernandez, female   DOB: 74 y.o.   MRN: [id number]   HPI Patient presents with pain in the outside of both feet stating it appeared to occur spontaneously and is been present for around 2 years.  Patient states it is nontender and does not seem to have grown further from that but she was concerned about the size and wanted no different options.  She does not smoke and likes to be active   Review of Systems  All other systems reviewed and are negative.       Objective:  Physical Exam Vitals and nursing note reviewed.  Constitutional:      Appearance: She is well-developed.  Pulmonary:     Effort: Pulmonary effort is normal.  Musculoskeletal:        General: Normal range of motion.  Skin:    General: Skin is warm.  Neurological:     Mental Status: She is alert.     Neurovascular status found to be intact muscle strength was found to be adequate range of motion is adequate.  Patient is noted to have hypertrophy of the lateral side of both feet around the base of the fifth metatarsal with what appears to be hard like enlargements of this area both measuring approximate 2 cm x 1.5 cm.  There appears to be no subcutaneous type issue and it appears to be a bony or calcification type issue     Assessment:  Probability for some form of calcification which appeared spontaneously but has not gotten worse over the last 2 years and is nonpainful     Plan:  H&P x-ray reviewed.  I did discuss this and I reviewed treatment options including excision with pathology MRI or leaving them alone given they have not changed in 2 years.  She wants to think about her options and I was unable to tell her exactly what this is except it appears to be some form of unknown calcification and the only way I can know definitively what is going on would be pathology.  Patient understands this and is good to talk to her daughter and decide what she wants to do  X-ray indicates there  is calcification lateral side but it does not appear to be connected to the metatarsal shaft or base of fifth metatarsal bilateral

## 2019-09-18 ENCOUNTER — Ambulatory Visit (INDEPENDENT_AMBULATORY_CARE_PROVIDER_SITE_OTHER): Payer: Medicare Other | Admitting: Allergy

## 2019-09-18 ENCOUNTER — Encounter: Payer: Self-pay | Admitting: Allergy

## 2019-09-18 ENCOUNTER — Other Ambulatory Visit: Payer: Self-pay

## 2019-09-18 VITALS — BP 140/86 | HR 82 | Temp 98.1°F | Ht 63.5 in | Wt 226.4 lb

## 2019-09-18 DIAGNOSIS — T783XXD Angioneurotic edema, subsequent encounter: Secondary | ICD-10-CM | POA: Diagnosis not present

## 2019-09-18 DIAGNOSIS — K9049 Malabsorption due to intolerance, not elsewhere classified: Secondary | ICD-10-CM

## 2019-09-18 DIAGNOSIS — L508 Other urticaria: Secondary | ICD-10-CM | POA: Diagnosis not present

## 2019-09-18 MED ORDER — FAMOTIDINE 20 MG PO TABS: 20.0000 mg | ORAL_TABLET | Freq: Two times a day (BID) | ORAL | 5 refills | Status: DC

## 2019-09-18 NOTE — Patient Instructions (Addendum)
Hives  - at this time etiology of hives and swelling is unknown.  Hives can be caused by a variety of different triggers including illness/infection, foods, medications, stings, exercise, pressure, vibrations, extremes of temperature to name a few however majority of the time there is no identifiable trigger.  Your symptoms have been ongoing for >6 weeks making this chronic thus will obtain labwork to evaluate: CBC w diff, CMP, tryptase, hive panel, environmental panel, alpha-gal panel, inflammatory markers  - for management of hives recommend the following high-dose antihistamine regimen: Claritin 10mg  1 tab twice a day and Pepcid 20mg  1 tab twice a day.  Keep these available at home if hives return  - let us know if you need to start the above medications and if they help in controlling hives   Food intolerance  - fruits causing itching and symptoms following ingestion may be IgE (allergen driven) or none IgE driven  - will obtain serum IgE levels to fruits  - would avoid fruits until levels return -   The following are categories that relate to food allergy/sensitivity/intolerances:   Allergy: food allergy is when you have eaten a food, developed an allergic reaction after eating the food and have IgE to the food (positive food testing either by skin testing or blood testing).  Food allergy could lead to life threatening symptoms  Sensitivity: occurs when you have IgE to a food (positive food testing either by skin testing or blood testing) but is a food you eat without any issues.  This is not an allergy and we recommend keeping the food in the diet  Intolerance: this is when you have negative testing by either skin testing or blood testing thus not allergic but the food causes symptoms (like belly pain, bloating, diarrhea etc) with ingestion.  These foods should be avoided to prevent symptoms.    Follow-up in 4-6 months
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
## 2019-09-26 LAB — ALPHA-GAL PANEL
Alpha Gal IgE*: <0.1 kU/L (ref <0.10)
Beef (Bos spp) IgE: <0.1 kU/L (ref <0.35)
Class Interpretation: 0
Class Interpretation: 0
Class Interpretation: 0
Lamb/Mutton (Ovis spp) IgE: <0.1 kU/L (ref <0.35)
Pork (Sus spp) IgE: <0.1 kU/L (ref <0.35)

## 2019-09-26 LAB — CHRONIC URTICARIA: cu index: <4.6 (ref <10)

## 2019-09-26 LAB — ALLERGENS W/TOTAL IGE AREA 2
Alternaria Alternata IgE: <0.1 kU/L
Aspergillus Fumigatus IgE: <0.1 kU/L
Bermuda Grass IgE: <0.1 kU/L
Cat Dander IgE: <0.1 kU/L
Cedar, Mountain IgE: <0.1 kU/L
Cladosporium Herbarum IgE: <0.1 kU/L
Cockroach, German IgE: <0.1 kU/L
Common Silver Birch IgE: 0.23 kU/L — AB
Cottonwood IgE: <0.1 kU/L
D Farinae IgE: <0.1 kU/L
D Pteronyssinus IgE: <0.1 kU/L
Dog Dander IgE: <0.1 kU/L
Elm, American IgE: <0.1 kU/L
IgE (Immunoglobulin E), Serum: 450 IU/mL (ref 6–495)
Johnson Grass IgE: <0.1 kU/L
Maple/Box Elder IgE: <0.1 kU/L
Mouse Urine IgE: <0.1 kU/L
Oak, White IgE: <0.1 kU/L
Pecan, Hickory IgE: <0.1 kU/L
Penicillium Chrysogen IgE: <0.1 kU/L
Pigweed, Rough IgE: <0.1 kU/L
Ragweed, Short IgE: <0.1 kU/L
Sheep Sorrel IgE Qn: <0.1 kU/L
Timothy Grass IgE: <0.1 kU/L
White Mulberry IgE: <0.1 kU/L

## 2019-09-26 LAB — ALLERGEN PROFILE, FOOD-CITRUS
Allergen Grapefruit IgE: <0.1 kU/L
Allergen Lime IgE: <0.1 kU/L
Lemon: <0.1 kU/L
Orange: <0.1 kU/L
Tangerine IgE: <0.1 kU/L

## 2019-09-26 LAB — SEDIMENTATION RATE: Sed Rate: 38 mm/hr (ref 0–40)

## 2019-09-26 LAB — THYROID ANTIBODIES
Thyroglobulin Antibody: <1 IU/mL (ref 0.0–0.9)
Thyroperoxidase Ab SerPl-aCnc: <8 IU/mL (ref 0–34)

## 2019-09-26 LAB — ALLERGEN PROFILE, FOOD-FRUIT
Allergen Apple, IgE: <0.1 kU/L
Allergen Banana IgE: <0.1 kU/L
Allergen Grape IgE: <0.1 kU/L
Allergen Pear IgE: <0.1 kU/L
Allergen, Peach f95: <0.1 kU/L

## 2019-09-26 LAB — ANA W/REFLEX: Anti Nuclear Antibody (ANA): NEGATIVE

## 2019-09-26 LAB — TRYPTASE: Tryptase: 4.9 ug/L (ref 2.2–13.2)

## 2020-02-19 ENCOUNTER — Ambulatory Visit: Payer: Medicare Other | Admitting: Allergy

## 2020-03-23 ENCOUNTER — Encounter: Payer: Self-pay | Admitting: Allergy

## 2020-03-23 ENCOUNTER — Other Ambulatory Visit: Payer: Self-pay

## 2020-03-23 ENCOUNTER — Ambulatory Visit (INDEPENDENT_AMBULATORY_CARE_PROVIDER_SITE_OTHER): Payer: Medicare Other | Admitting: Allergy

## 2020-03-23 VITALS — BP 134/90 | HR 139 | Temp 97.8°F | Resp 18 | Ht 63.0 in | Wt 223.2 lb

## 2020-03-23 DIAGNOSIS — K9049 Malabsorption due to intolerance, not elsewhere classified: Secondary | ICD-10-CM

## 2020-03-23 DIAGNOSIS — T783XXD Angioneurotic edema, subsequent encounter: Secondary | ICD-10-CM

## 2020-03-23 DIAGNOSIS — L508 Other urticaria: Secondary | ICD-10-CM | POA: Diagnosis not present

## 2020-03-23 DIAGNOSIS — T781XXD Other adverse food reactions, not elsewhere classified, subsequent encounter: Secondary | ICD-10-CM | POA: Diagnosis not present

## 2020-03-23 NOTE — Progress Notes (Signed)
Follow-up Note  RE: Tara Hernandez MRN: [id number] DOB: 1945-10-13 Date of Office Visit: 03/23/2020   History of present illness: Tara Hernandez is a 75 y.o. female presenting today for follow-up of urticaria with angioedema and food intolerance. She was last seen in the office on 09/18/2019 by myself for initial visit. Currently she states she has been doing well since her last visit without any major health changes, surgeries or hospitalizations. She states she has not had any further hives or swelling since the last visit. She states she did take the Claritin and Pepcid a couple times after the last visit but the hives stopped and that she no longer continuing on the medication. She feels that the hives were related to the rash that she was under due to the neighbors where she lived smoking a lot and those feelings getting into her where she lives. She states this has been much better and that the smoke level is reduced. She also states that she has been eating the fruits but only in moderation. She states she runs and issues if she is eating sting oranges on a daily basis that she does limit how much she eats in a week.    Review of systems: Review of Systems  Constitutional: Negative.   HENT: Negative.   Eyes: Negative.   Respiratory: Negative.   Cardiovascular: Negative.   Gastrointestinal: Negative.   Musculoskeletal: Negative.   Skin: Negative.   Neurological: Negative.     All other systems negative unless noted above in HPI  Past medical/social/surgical/family history have been reviewed and are unchanged unless specifically indicated below.  No changes  Medication List: Current Outpatient Medications  Medication Sig Dispense Refill  . naproxen sodium (ALEVE) 220 MG tablet Take 220 mg by mouth daily as needed.    . famotidine (PEPCID) 20 MG tablet Take 1 tablet (20 mg total) by mouth 2 (two) times daily. (Patient not taking: Reported on 03/23/2020) 60 tablet 5  . mupirocin  ointment (BACTROBAN) 2 % Apply to small boils as needed. (Patient not taking: No sig reported) 22 g 1   No current facility-administered medications for this visit.     Known medication allergies: No Known Allergies   Physical examination: Blood pressure 134/90, pulse (!) 139, temperature 97.8 F (36.6 C), resp. rate 18, height 5\' 3"  (1.6 m), weight 223 lb 3.2 oz (101.2 kg), SpO2 98 %.  General: Alert, interactive, in no acute distress. HEENT: PERRLA, TMs pearly gray, turbinates non-edematous without discharge, post-pharynx non erythematous. Neck: Supple without lymphadenopathy. Lungs: Clear to auscultation without wheezing, rhonchi or rales. {no increased work of breathing. CV: Tachycardic without murmurs. Abdomen: Nondistended, nontender. Skin: Warm and dry, without lesions or rashes. Extremities:  No clubbing, cyanosis or edema. Neuro:   Grossly intact.  Diagnositics/Labs: Labs:  Component     Latest Ref Rng & Units 09/18/2019  IgE (Immunoglobulin E), Serum     6 - 495 IU/mL 450  D Pteronyssinus IgE     Class 0 kU/L <0.10  D Farinae IgE     Class 0 kU/L <0.10  Cat Dander IgE     Class 0 kU/L <0.10  Dog Dander IgE     Class 0 kU/L <0.10  [place] Grass IgE     Class 0 kU/L <0.10  Timothy Grass IgE     Class 0 kU/L <0.10  Johnson Grass IgE     Class 0 kU/L <0.10  Cockroach, [place] IgE     Class  0 kU/L <0.10  Penicillium Chrysogen IgE     Class 0 kU/L <0.10  Cladosporium Herbarum IgE     Class 0 kU/L <0.10  Aspergillus Fumigatus IgE     Class 0 kU/L <0.10  Alternaria Alternata IgE     Class 0 kU/L <0.10  Maple/Box Elder IgE     Class 0 kU/L <0.10  Common Silver Wendee Copp IgE     Class 0/I kU/L 0.23 (A)  Cedar, Mountain IgE     Class 0 kU/L <0.10  Oak, White IgE     Class 0 kU/L <0.10  Elm, American IgE     Class 0 kU/L <0.10  C[place]wood IgE     Class 0 kU/L <0.10  Pecan, Hickory IgE     Class 0 kU/L <0.10  White Mulberry IgE     Class 0 kU/L <0.10   Ragweed, Short IgE     Class 0 kU/L <0.10  Pigweed, Rough IgE     Class 0 kU/L <0.10  Sheep Sorrel IgE Qn     Class 0 kU/L <0.10  Mouse Urine IgE     Class 0 kU/L <0.10  Beef (Bos spp) IgE     <0.35 kU/L <0.10  Class Interpretation      0  Lamb/Mutton (Ovis spp) IgE     <0.35 kU/L <0.10  Class Interpretation      0  Pork (Sus spp) IgE     <0.35 kU/L <0.10  Class Interpretation      0  Alpha Gal IgE*     <0.10 kU/L <0.10  Orange     Class 0 kU/L <0.10  Allergen Grapefruit IgE     Class 0 kU/L <0.10  Lemon     Class 0 kU/L <0.10  Allergen Lime IgE     Class 0 kU/L <0.10  Tangerine IgE     Class 0 kU/L <0.10  Allergen Grape IgE     Class 0 kU/L <0.10  Allergen Apple, IgE     Class 0 kU/L <0.10  Allergen, Peach f95     Class 0 kU/L <0.10  Allergen Pear IgE     Class 0 kU/L <0.10  Allergen Banana IgE     Class 0 kU/L <0.10  Thyroperoxidase Ab SerPl-aCnc     0 - 34 IU/mL <8  Thyroglobulin Antibody     0.0 - 0.9 IU/mL <1.0  cu index     <10 <4.6  Tryptase     2.2 - 13.2 ug/L 4.9  Sed Rate     0 - 40 mm/hr 38  Anti Nuclear Antibody (ANA)     Negative Negative   Assessment and plan: Urticaria and angioedema  - at this time etiology of hives and swelling is spontaneous in nature.  Hives can be caused by a variety of different triggers including illness/infection, foods, medications, stings, exercise, pressure, vibrations, extremes of temperature to name a few however majority of the time there is no identifiable trigger.  - labwork was reassuring and only showed low IgE level to tree pollen (birch) - let us know if you have return of hives or swelling.  At this time would recommend antihistamine regimen of Pepcid + Xyzal  Food intolerance  - fruits tested were negative to IgE (orange, grapefruit, lemon, lime, tangerine, grape, apple, peach, pear, banana) -   The following are categories that relate to food allergy/sensitivity/intolerances:   Allergy: food  allergy is when you have eaten a food, developed an  allergic reaction after eating the food and have IgE to the food (positive food testing either by skin testing or blood testing).  Food allergy could lead to life threatening symptoms  Sensitivity: occurs when you have IgE to a food (positive food testing either by skin testing or blood testing) but is a food you eat without any issues.  This is not an allergy and we recommend keeping the food in the diet  Intolerance: this is when you have negative testing by either skin testing or blood testing thus not allergic but the food causes symptoms (like belly pain, bloating, diarrhea etc) with ingestion.  These foods should be avoided to prevent symptoms.    Oral allergy syndrome -The oral allergy syndrome (OAS) or pollen-food allergy syndrome (PFAS) is a relatively common form of food allergy, particularly in adults. It typically occurs in people who have pollen allergies when the immune system "sees" proteins on the food that look like proteins on the pollen. This results in the allergy antibody (IgE) binding to the food instead of the pollen. Patients typically report itching and/or mild swelling of the mouth and throat immediately following ingestion of certain uncooked fruits (including nuts) or raw vegetables. Only a very small number of affected individuals experience systemic allergic reactions, such as anaphylaxis which occurs with true food allergies.      Follow-up in 12 months or sooner if needed  I appreciate the opportunity to take part in Tara Hernandez's care. Please do not hesitate to contact me with questions.  Sincerely,   Prudy Feeler, MD Allergy/Immunology [facility] [place] [facility] [facility] [place]

## 2020-03-23 NOTE — Patient Instructions (Addendum)
Hives  - at this time etiology of hives and swelling is spontaneous in nature.  Hives can be caused by a variety of different triggers including illness/infection, foods, medications, stings, exercise, pressure, vibrations, extremes of temperature to name a few however majority of the time there is no identifiable trigger.  - labwork was reassuring and only showed low IgE level to tree pollen (birch) - let us know if you have return of hives or swelling.  At this time would recommend antihistamine regimen of Pepcid + Xyzal  Food intolerance  - fruits tested were negative to IgE (orange, grapefruit, lemon, lime, tangerine, grape, apple, peach, pear, banana) -   The following are categories that relate to food allergy/sensitivity/intolerances:   Allergy: food allergy is when you have eaten a food, developed an allergic reaction after eating the food and have IgE to the food (positive food testing either by skin testing or blood testing).  Food allergy could lead to life threatening symptoms  Sensitivity: occurs when you have IgE to a food (positive food testing either by skin testing or blood testing) but is a food you eat without any issues.  This is not an allergy and we recommend keeping the food in the diet  Intolerance: this is when you have negative testing by either skin testing or blood testing thus not allergic but the food causes symptoms (like belly pain, bloating, diarrhea etc) with ingestion.  These foods should be avoided to prevent symptoms.    Oral allergy syndrome -The oral allergy syndrome (OAS) or pollen-food allergy syndrome (PFAS) is a relatively common form of food allergy, particularly in adults. It typically occurs in people who have pollen allergies when the immune system "sees" proteins on the food that look like proteins on the pollen. This results in the allergy antibody (IgE) binding to the food instead of the pollen. Patients typically report itching and/or mild swelling of  the mouth and throat immediately following ingestion of certain uncooked fruits (including nuts) or raw vegetables. Only a very small number of affected individuals experience systemic allergic reactions, such as anaphylaxis which occurs with true food allergies.      Follow-up in 12 months or sooner if needed

## 2020-05-05 LAB — IFOBT (OCCULT BLOOD): IFOBT: NEGATIVE

## 2020-05-29 ENCOUNTER — Telehealth: Payer: Self-pay | Admitting: Internal Medicine

## 2020-05-30 NOTE — Telephone Encounter (Signed)
Medical Assistant left message on patient's home and cell voicemail. Voicemail states to give a call back to [place] with [facility] at [phone]. Per patients signed DPR MA informed patient of screening being negative and a repeat being completed in 3 years. Patients HM has been updated with results.

## 2020-09-30 ENCOUNTER — Encounter: Payer: Medicare Other | Admitting: Internal Medicine

## 2020-10-27 ENCOUNTER — Encounter: Payer: Self-pay | Admitting: Internal Medicine

## 2020-10-27 ENCOUNTER — Ambulatory Visit: Payer: Medicare Other | Attending: Internal Medicine | Admitting: Internal Medicine

## 2020-10-27 ENCOUNTER — Ambulatory Visit: Payer: Medicare Other | Admitting: Internal Medicine

## 2020-10-27 ENCOUNTER — Other Ambulatory Visit: Payer: Self-pay

## 2020-10-27 VITALS — BP 148/83 | HR 86 | Resp 16 | Ht 62.5 in | Wt 210.8 lb

## 2020-10-27 DIAGNOSIS — M109 Gout, unspecified: Secondary | ICD-10-CM

## 2020-10-27 DIAGNOSIS — Z6837 Body mass index (BMI) 37.0-37.9, adult: Secondary | ICD-10-CM

## 2020-10-27 DIAGNOSIS — Z0001 Encounter for general adult medical examination with abnormal findings: Secondary | ICD-10-CM | POA: Diagnosis not present

## 2020-10-27 DIAGNOSIS — Z7189 Other specified counseling: Secondary | ICD-10-CM | POA: Diagnosis not present

## 2020-10-27 DIAGNOSIS — M1731 Unilateral post-traumatic osteoarthritis, right knee: Secondary | ICD-10-CM

## 2020-10-27 DIAGNOSIS — Z1231 Encounter for screening mammogram for malignant neoplasm of breast: Secondary | ICD-10-CM

## 2020-10-27 DIAGNOSIS — Z2821 Immunization not carried out because of patient refusal: Secondary | ICD-10-CM | POA: Diagnosis not present

## 2020-10-27 DIAGNOSIS — I10 Essential (primary) hypertension: Secondary | ICD-10-CM | POA: Diagnosis not present

## 2020-10-27 DIAGNOSIS — Z0000 Encounter for general adult medical examination without abnormal findings: Secondary | ICD-10-CM

## 2020-10-27 NOTE — Patient Instructions (Signed)
Purchase Voltaren gel over-the-counter.  You can use this gel on your right knee 2-3 times a day to help decrease pain.  If you do execute a living will or healthcare power of attorney, please bring a copy for our records.  Your blood pressure is not at goal.  Normal blood pressure is 120/80 or lower.  High blood pressure puts you at risk for heart attack and strokes.  Check your blood pressure at least twice a week and record the readings.  We will have you follow-up with our clinical pharmacist in October.  Bring your home blood pressure readings with you.  Healthy Eating Following a healthy eating pattern may help you to achieve and maintain a healthy body weight, reduce the risk of chronic disease, and live a long and productive life. It is important to follow a healthy eating pattern at an appropriate calorie level for your body. Your nutritional needs should be metprimarily through food by choosing a variety of nutrient-rich foods. What are tips for following this plan? Reading food labels Read labels and choose the following: Reduced or low sodium. Juices with 100% fruit juice. Foods with low saturated fats and high polyunsaturated and monounsaturated fats. Foods with whole grains, such as whole wheat, cracked wheat, brown rice, and wild rice. Whole grains that are fortified with folic acid. This is recommended for women who are pregnant or who want to become pregnant. Read labels and avoid the following: Foods with a lot of added sugars. These include foods that contain brown sugar, corn sweetener, corn syrup, dextrose, fructose, glucose, high-fructose corn syrup, honey, invert sugar, lactose, malt syrup, maltose, molasses, raw sugar, sucrose, trehalose, or turbinado sugar. Do not eat more than the following amounts of added sugar per day: 6 teaspoons (25 g) for women. 9 teaspoons (38 g) for men. Foods that contain processed or refined starches and grains. Refined grain products, such  as white flour, degermed cornmeal, white bread, and white rice. Shopping Choose nutrient-rich snacks, such as vegetables, whole fruits, and nuts. Avoid high-calorie and high-sugar snacks, such as potato chips, fruit snacks, and candy. Use oil-based dressings and spreads on foods instead of solid fats such as butter, stick margarine, or cream cheese. Limit pre-made sauces, mixes, and "instant" products such as flavored rice, instant noodles, and ready-made pasta. Try more plant-protein sources, such as tofu, tempeh, black beans, edamame, lentils, nuts, and seeds. Explore eating plans such as the Mediterranean diet or vegetarian diet. Cooking Use oil to saut or stir-fry foods instead of solid fats such as butter, stick margarine, or lard. Try baking, boiling, grilling, or broiling instead of frying. Remove the fatty part of meats before cooking. Steam vegetables in water or broth. Meal planning  At meals, imagine dividing your plate into fourths: One-half of your plate is fruits and vegetables. One-fourth of your plate is whole grains. One-fourth of your plate is protein, especially lean meats, poultry, eggs, tofu, beans, or nuts. Include low-fat dairy as part of your daily diet.  Lifestyle Choose healthy options in all settings, including home, work, school, restaurants, or stores. Prepare your food safely: Wash your hands after handling raw meats. Keep food preparation surfaces clean by regularly washing with hot, soapy water. Keep raw meats separate from ready-to-eat foods, such as fruits and vegetables. Cook seafood, meat, poultry, and eggs to the recommended internal temperature. Store foods at safe temperatures. In general: Keep cold foods at 106F (4.4C) or below. Keep hot foods at 1106F (60C) or above. Keep your  freezer at [facility] (-17.8C) or below. Foods are no longer safe to eat when they have been between the temperatures of 40-140F (4.4-60C) for more than 2 hours. What  foods should I eat? Fruits Aim to eat 2 cup-equivalents of fresh, canned (in natural juice), or frozen fruits each day. Examples of 1 cup-equivalent of fruit include 1 small apple, 8large strawberries, 1 cup canned fruit,  cup dried fruit, or 1 cup 100% juice. Vegetables Aim to eat 2-3 cup-equivalents of fresh and frozen vegetables each day, including different varieties and colors. Examples of 1 cup-equivalent of vegetables include 2 medium carrots, 2 cups raw, leafy greens, 1 cup choppedvegetable (raw or cooked), or 1 medium baked potato. Grains Aim to eat 6 ounce-equivalents of whole grains each day. Examples of 1 ounce-equivalent of grains include 1 slice of bread, 1 cup ready-to-eat cereal,3 cups popcorn, or  cup cooked rice, pasta, or cereal. Meats and other proteins Aim to eat 5-6 ounce-equivalents of protein each day. Examples of 1 ounce-equivalent of protein include 1 egg, 1/2 cup nuts or seeds, or 1 tablespoon (16 g) peanut butter. A cut of meat or fish that is the size of a deck of cards is about 3-4 ounce-equivalents. Of the protein you eat each week, try to have at least 8 ounces come from seafood. This includes salmon, trout, herring, and anchovies. Dairy Aim to eat 3 cup-equivalents of fat-free or low-fat dairy each day. Examples of 1 cup-equivalent of dairy include 1 cup (240 mL) milk, 8 ounces (250 g) yogurt,1 ounces (44 g) natural cheese, or 1 cup (240 mL) fortified soy milk. Fats and oils Aim for about 5 teaspoons (21 g) per day. Choose monounsaturated fats, such as canola and olive oils, avocados, peanut butter, and most nuts, or polyunsaturated fats, such as sunflower, corn, and soybean oils, walnuts, pine nuts, sesame seeds, sunflower seeds, and flaxseed. Beverages Aim for six 8-oz glasses of water per day. Limit coffee to three to five 8-oz cups per day. Limit caffeinated beverages that have added calories, such as soda and energy drinks. Limit alcohol intake to no more  than 1 drink a day for nonpregnant women and 2 drinks a day for men. One drink equals 12 oz of beer (355 mL), 5 oz of wine (148 mL), or 1 oz of hard liquor (44 mL). Seasoning and other foods Avoid adding excess amounts of salt to your foods. Try flavoring foods with herbs and spices instead of salt. Avoid adding sugar to foods. Try using oil-based dressings, sauces, and spreads instead of solid fats. This information is based on general [place] nutrition guidelines. For more information, visit [employer]. Exact amounts may vary based on your nutrition needs. Summary A healthy eating plan may help you to maintain a healthy weight, reduce the risk of chronic diseases, and stay active throughout your life. Plan your meals. Make sure you eat the right portions of a variety of nutrient-rich foods. Try baking, boiling, grilling, or broiling instead of frying. Choose healthy options in all settings, including home, work, school, restaurants, or stores. This information is not intended to replace advice given to you by your health care provider. Make sure you discuss any questions you have with your healthcare provider. Document Revised: 06/03/2017 Document Reviewed: 06/03/2017 Elsevier Patient Education  [place].

## 2020-10-27 NOTE — Progress Notes (Signed)
Subjective:   Tara Hernandez is a 75 y.o. female who presents for an Initial Medicare Annual Wellness Visit. HTN, HL, obesity and OA RT knee   Review of Systems    CVD:  BP elev today and on last visit.  She checks BP but not regularly.  Reports reading in the 120-130s/70-high 80s.  Blood pressure elevated on last visit.  She has been resistant to starting blood pressure medication.  Limits salt in foods.  No CP/SOB/LE edema/HA/dizziness Obesity: She has lost 16 pounds in the past 1 year.  She attributes this to changes in her eating habits.  She would like to be more active but is limited by the arthritis in her right knee MSK: Complains of pain, swelling and stiffness in the joints of the right hand over the past 1 month.  She has been using Aleve which helps a little.  She has family history of gout.  No personal history of gout.     Objective:    Today's Vitals   10/27/20 1015  BP: (!) 148/83  Pulse: 86  Resp: 16  SpO2: 98%  Weight: 210 lb 12.8 oz (95.6 kg)  Height: 5' 2.5" (1.588 m)  Repeat blood pressure 166/100 Body mass index is 37.94 kg/m. Wt Readings from Last 3 Encounters:  10/27/20 210 lb 12.8 oz (95.6 kg)  03/23/20 223 lb 3.2 oz (101.2 kg)  09/18/19 226 lb 6.4 oz (102.7 kg)  Constitutional: Appears well-developed and well-nourished. No distress. Head: Normocephalic. Atraumatic Ears: External right and left ear normal.   Eyes: Conjunctivae and EOM are normal. PERRLA, no scleral icterus.  Mouth: no oral lesions, good oral hygiene, throat clear without exudates Neck: Neck supple.  No tracheal deviation. No thyromegaly. No cervical LN CVS: RRR, S1/S2 +, no murmurs, no gallops, no carotid bruit. No JVD Pulmonary: Effort and breath sounds normal, no stridor, rhonchi, wheezes, rales.  Musculoskeletal: Patient has mild valgum deformity of the knees noted with ambulation.  Enlargement of both knee joints. Hands: Significant enlargement and swelling of PIP joints of the  right hand   Psychiatric: Normal mood and affect. Behavior, judgment, thought content normal.     Advanced Directives 10/27/2020 10/04/2016 08/31/2016 04/07/2015 03/25/2014  Does Patient Have a Medical Advance Directive? No No No No No  Would patient like information on creating a medical advance directive? Yes (Inpatient - patient defers creating a medical advance directive at this time - Information given) No - Patient declined No - Patient declined No - patient declined information No - patient declined information  Patient given packet on advanced directive.  I went over with her the components that can make up an advanced directive to include a living will and healthcare power of attorney.  Current Medications (verified) Outpatient Encounter Medications as of 10/27/2020  Medication Sig   mupirocin ointment (BACTROBAN) 2 % Apply to small boils as needed.   naproxen sodium (ALEVE) 220 MG tablet Take 220 mg by mouth daily as needed.   famotidine (PEPCID) 20 MG tablet Take 1 tablet (20 mg total) by mouth 2 (two) times daily. (Patient not taking: No sig reported)   No facility-administered encounter medications on file as of 10/27/2020.    Allergies (verified) Patient has no known allergies.   History: Past Medical History:  Diagnosis Date   Gout    Regurgitation and rechewing    Past Surgical History:  Procedure Laterality Date   [place]  Family History  Problem Relation Age of Onset   Heart disease Father    Social History   Socioeconomic History   Marital status: Divorced    Spouse name: Not on file   Number of children: Not on file   Years of education: Not on file   Highest education level: Not on file  Occupational History   Not on file  Tobacco Use   Smoking status: Never   Smokeless tobacco: Never  Vaping Use   Vaping Use: Never used  Substance and Sexual Activity   Alcohol use: No   Drug use: No   Sexual activity: Not Currently   Other Topics Concern   Not on file  Social History Narrative   Not on file   Social Determinants of Health   Financial Resource Strain: Not on file  Food Insecurity: Not on file  Transportation Needs: Not on file  Physical Activity: Not on file  Stress: Not on file  Social Connections: Not on file    Tobacco Counseling Patient does not smoke.  Clinical Intake:  Diabetic?no  Activities of Daily Living In your present state of health, do you have any difficulty performing the following activities: 10/27/2020  Hearing? N  Vision? N  Difficulty concentrating or making decisions? N  Walking or climbing stairs? Y  Dressing or bathing? N  Doing errands, shopping? N  Preparing Food and eating ? N  Using the Toilet? N  In the past six months, have you accidently leaked urine? N  Do you have problems with loss of bowel control? N  Managing your Medications? N  Managing your Finances? N  Housekeeping or managing your Housekeeping? N  Some recent data might be hidden  RT knee bothers her post injury 20 yrs ago when she fell. Has known arthritis in the RT knee.  Does not feel it has gotten worse.  Pain in the knee is much less better with wgh loss  Patient Care Team: Ladell Pier, MD as PCP - General (Internal Medicine)  Indicate any recent Medical Services you may have received from other than [facility] providers in the past year (date may be approximate).     Assessment:   This is a routine wellness examination for Tara Hernandez.  Hearing/Vision screen Vision Screening   Right eye Left eye Both eyes  Without correction     With correction '20 20 20 20 20 20  '$ Whisper test: Normal bilaterally  Dietary issues and exercise activities discussed:  Cut out sugar and white bread.  Has loss some wgh Tries to stay active - mainly stretching exercises. Does a little walking like when she goes grocery shopping.  Also tries to walk around her building but this is infrequent   Goals  Addressed   None   Depression Screen PHQ 2/9 Scores 10/27/2020 07/28/2019 09/06/2017 10/04/2016 08/31/2016 04/07/2015 03/25/2014  PHQ - 2 Score 0 0 0 0 0 0 0    Fall Risk Fall Risk  10/27/2020 07/28/2019 09/06/2017 04/07/2015 03/25/2014  Falls in the past year? 0 0 No Yes No  Number falls in past yr: 0 - - 1 -  Injury with Fall? 0 - - No -  Risk for fall due to : No Fall Risks - - - -    FALL RISK PREVENTION PERTAINING TO THE HOME:  Any stairs in or around the home? No  If so, are there any without handrails?  NA Home free of loose throw rugs in walkways, pet beds,  electrical cords, etc? Yes  Adequate lighting in your home to reduce risk of falls? Yes   ASSISTIVE DEVICES UTILIZED TO PREVENT FALLS:  Life alert? No  Use of a cane, walker or w/c? No  Grab bars in the bathroom? Yes  Shower chair or bench in shower? No  Elevated toilet seat or a handicapped toilet? Yes   TIMED UP AND GO:  Was the test performed? Yes .  Length of time to ambulate 10 feet: 8 sec.   Gait slow and steady without use of assistive device  Cognitive Function: MMSE - Mini Mental State Exam 10/27/2020  Orientation to time 5  Orientation to Place 5  Registration 3  Attention/ Calculation 5  Recall 3  Language- name 2 objects 2  Language- repeat 1  Language- follow 3 step command 3  Language- read & follow direction 1  Write a sentence 1  Copy design 0  Total score 29        Immunizations  There is no immunization history on file for this patient.  TDAP status: Due, Education has been provided regarding the importance of this vaccine. Advised may receive this vaccine at local pharmacy or Health Dept. Aware to provide a copy of the vaccination record if obtained from local pharmacy or Health Dept. Verbalized acceptance and understanding.  Flu Vaccine status: Declined, Education has been provided regarding the importance of this vaccine but patient still declined. Advised may receive this vaccine at local  pharmacy or Health Dept. Aware to provide a copy of the vaccination record if obtained from local pharmacy or Health Dept. Verbalized acceptance and understanding.  Pneumococcal vaccine status: Declined,  Education has been provided regarding the importance of this vaccine but patient still declined. Advised may receive this vaccine at local pharmacy or Health Dept. Aware to provide a copy of the vaccination record if obtained from local pharmacy or Health Dept. Verbalized acceptance and understanding.   Covid-19 vaccine status: Declined, Education has been provided regarding the importance of this vaccine but patient still declined. Advised may receive this vaccine at local pharmacy or Health Dept.or vaccine clinic. Aware to provide a copy of the vaccination record if obtained from local pharmacy or Health Dept. Verbalized acceptance and understanding.  Qualifies for Shingles Vaccine? Yes   Zostavax completed  DECLINED   Shingrix Completed?: No.    Education has been provided regarding the importance of this vaccine. Patient has been advised to call insurance company to determine out of pocket expense if they have not yet received this vaccine. Advised may also receive vaccine at local pharmacy or Health Dept. Verbalized acceptance and understanding.  Screening Tests Health Maintenance  Topic Date Due   COVID-19 Vaccine (1) 11/12/2020 (Originally 07/02/1950)   INFLUENZA VACCINE  12/13/2020 (Originally 10/03/2020)   Zoster Vaccines- Shingrix (1 of 2) 01/27/2021 (Originally 07/02/1995)   TETANUS/TDAP  10/27/2021 (Originally 07/01/1964)   PNA vac Low Risk Adult (1 of 2 - PCV13) 10/27/2021 (Originally 07/02/2010)   Fecal DNA (Cologuard)  05/14/2023   DEXA SCAN  Completed   Hepatitis C Screening  Completed   HPV VACCINES  Aged Out    Health Maintenance  There are no preventive care reminders to display for this patient.  Colorectal cancer screening: Type of screening: FOBT/FIT. Completed 05/12/20.  Repeat every 1 years  Mammogram status: Ordered  . Pt provided with contact info and advised to call to schedule appt.   Bone Density status: Completed 11/2017. Results reflect: Bone density results: NORMAL.  Repeat every 5 years.  Lung Cancer Screening: (Low Dose CT Chest recommended if Age 3-80 years, 30 pack-year currently smoking OR have quit w/in 15years.) does not qualify.   Lung Cancer Screening Referral: na  Additional Screening:  Hepatitis C Screening: does qualify; Completed 07/2019  Vision Screening: Recommended annual ophthalmology exams for early detection of glaucoma and other disorders of the eye. Is the patient up to date with their annual eye exam?  Yes  Who is the provider or what is the name of the office in which the patient attends annual eye exams? [facility] If pt is not established with a provider, would they like to be referred to a provider to establish care?  na .   Dental Screening: Recommended annual dental exams for proper oral hygiene.  Saw dentist last mth, Dr. Posey Pronto. Has a partial but not able to wear it because it hurts her gum  Community Resource Referral / Chronic Care Management: CRR required this visit?  No   CCM required this visit?  No      Plan:   1. Encounter for Medicare annual wellness exam -Patient declines all age-appropriate vaccines including COVID-vaccine.  I discussed with her the importance of being vaccinated to help protect herself from these infections.  She feels that she leads a healthy enough lifestyle and avoid crowds that she does not need them.  2. Advance directive discussed with patient Discussed advanced directive with her.  I went over our packet with her.  Should she decide to execute a living will or healthcare power of attorney, I request that she bring a copy for our records.  3. Essential hypertension Blood pressure elevated.  Even her home blood pressure readings are above normal.  Recommend and discussed  starting antihypertensive medication.  Patient declines.  Advised that she check her blood pressure at least twice a week and record the readings and follow-up with our clinical pharmacist in several weeks.  She is agreeable to doing this. DASH diet encouraged. - CBC - Comprehensive metabolic panel - Lipid panel  4. Post-traumatic osteoarthritis of right knee Encourage further weight loss. Recommend trying Voltaren gel which can be purchased over-the-counter  5. Class 2 severe obesity due to excess calories with serious comorbidity and body mass index (BMI) of 37.0 to 37.9 in adult [facility] Commended her on weight loss so far.  Further dietary counseling given.  Advised to eliminate sugary drinks from the diet, cut back on portion sizes of white carbohydrates, eat more lean white meat instead of red meat and incorporate fresh fruits and vegetables into the diet daily.  6. Gouty arthritis of right hand Examination of the hand looks like tophaceous gout.  I will refer her to a rheumatologist.  7. Influenza vaccine refused   8. COVID-19 vaccine dose declined   9. Tetanus, diphtheria, and acellular pertussis (Tdap) vaccination declined   10. Encounter for screening mammogram for malignant neoplasm of breast - MM Digital Screening; Future   I have personally reviewed and noted the following in the patient's chart:   Medical and social history Use of alcohol, tobacco or illicit drugs  Current medications and supplements including opioid prescriptions. Patient is not currently taking opioid prescriptions. Functional ability and status Nutritional status Physical activity Advanced directives List of other physicians Hospitalizations, surgeries, and ER visits in previous 12 months Vitals Screenings to include cognitive, depression, and falls Referrals and appointments  In addition, I have reviewed and discussed with patient certain preventive protocols,  quality metrics, and best  practice recommendations. A written personalized care plan for preventive services as well as general preventive health recommendations were provided to patient.     Karle Plumber, MD   10/27/2020

## 2020-10-28 LAB — COMPREHENSIVE METABOLIC PANEL
ALT: 10 IU/L (ref 0–32)
AST: 22 IU/L (ref 0–40)
Albumin/Globulin Ratio: 1.9 (ref 1.2–2.2)
Albumin: 4.6 g/dL (ref 3.7–4.7)
Alkaline Phosphatase: 83 IU/L (ref 44–121)
BUN/Creatinine Ratio: 18 (ref 12–28)
BUN: 14 mg/dL (ref 8–27)
Bilirubin Total: 0.4 mg/dL (ref 0.0–1.2)
CO2: 21 mmol/L (ref 20–29)
Calcium: 9.8 mg/dL (ref 8.7–10.3)
Chloride: 104 mmol/L (ref 96–106)
Creatinine, Ser: 0.79 mg/dL (ref 0.57–1.00)
Globulin, Total: 2.4 g/dL (ref 1.5–4.5)
Glucose: 88 mg/dL (ref 65–99)
Potassium: 4.4 mmol/L (ref 3.5–5.2)
Sodium: 141 mmol/L (ref 134–144)
Total Protein: 7 g/dL (ref 6.0–8.5)
eGFR: 78 mL/min/{1.73_m2} (ref >59)

## 2020-10-28 LAB — LIPID PANEL
Chol/HDL Ratio: 3.5 ratio (ref 0.0–4.4)
Cholesterol, Total: 200 mg/dL — ABNORMAL HIGH (ref 100–199)
HDL: 57 mg/dL (ref >39)
LDL Chol Calc (NIH): 131 mg/dL — ABNORMAL HIGH (ref 0–99)
Triglycerides: 68 mg/dL (ref 0–149)
VLDL Cholesterol Cal: 12 mg/dL (ref 5–40)

## 2020-10-28 LAB — CBC
Hematocrit: 39.1 % (ref 34.0–46.6)
Hemoglobin: 13.2 g/dL (ref 11.1–15.9)
MCH: 30.7 pg (ref 26.6–33.0)
MCHC: 33.8 g/dL (ref 31.5–35.7)
MCV: 91 fL (ref 79–97)
Platelets: 187 10*3/uL (ref 150–450)
RBC: 4.3 x10E6/uL (ref 3.77–5.28)
RDW: 13.2 % (ref 11.7–15.4)
WBC: 4.9 10*3/uL (ref 3.4–10.8)

## 2020-12-09 ENCOUNTER — Other Ambulatory Visit: Payer: Self-pay | Admitting: Internal Medicine

## 2020-12-09 DIAGNOSIS — Z1231 Encounter for screening mammogram for malignant neoplasm of breast: Secondary | ICD-10-CM

## 2022-06-04 ENCOUNTER — Encounter: Payer: Self-pay | Admitting: Family Medicine

## 2022-06-04 ENCOUNTER — Ambulatory Visit: Payer: 59 | Attending: Family Medicine | Admitting: Family Medicine

## 2022-06-04 VITALS — BP 148/86 | HR 79 | Ht 63.5 in | Wt 200.2 lb

## 2022-06-04 DIAGNOSIS — I110 Hypertensive heart disease with heart failure: Secondary | ICD-10-CM

## 2022-06-04 DIAGNOSIS — I2510 Atherosclerotic heart disease of native coronary artery without angina pectoris: Secondary | ICD-10-CM | POA: Diagnosis not present

## 2022-06-04 DIAGNOSIS — M109 Gout, unspecified: Secondary | ICD-10-CM

## 2022-06-04 DIAGNOSIS — I4891 Unspecified atrial fibrillation: Secondary | ICD-10-CM | POA: Diagnosis not present

## 2022-06-04 DIAGNOSIS — I5042 Chronic combined systolic (congestive) and diastolic (congestive) heart failure: Secondary | ICD-10-CM

## 2022-06-04 DIAGNOSIS — E782 Mixed hyperlipidemia: Secondary | ICD-10-CM

## 2022-06-04 DIAGNOSIS — Z86711 Personal history of pulmonary embolism: Secondary | ICD-10-CM

## 2022-06-04 NOTE — Patient Instructions (Signed)
Managing Your Hypertension Hypertension, also called high blood pressure, is when the force of the blood pressing against the walls of the arteries is too strong. Arteries are blood vessels that carry blood from your heart throughout your body. Hypertension forces the heart to work harder to pump blood and may cause the arteries to become narrow or stiff. Understanding blood pressure readings A blood pressure reading includes a higher number over a lower number: The first, or top, number is called the systolic pressure. It is a measure of the pressure in your arteries as your heart beats. The second, or bottom number, is called the diastolic pressure. It is a measure of the pressure in your arteries as the heart relaxes. For most people, a normal blood pressure is below 120/80. Your personal target blood pressure may vary depending on your medical conditions, your age, and other factors. Blood pressure is classified into four stages. Based on your blood pressure reading, your health care provider may use the following stages to determine what type of treatment you need, if any. Systolic pressure and diastolic pressure are measured in a unit called millimeters of mercury (mmHg). Normal Systolic pressure: below 120. Diastolic pressure: below 80. Elevated Systolic pressure: 120-129. Diastolic pressure: below 80. Hypertension stage 1 Systolic pressure: 130-139. Diastolic pressure: 80-89. Hypertension stage 2 Systolic pressure: 140 or above. Diastolic pressure: 90 or above. How can this condition affect me? Managing your hypertension is very important. Over time, hypertension can damage the arteries and decrease blood flow to parts of the body, including the brain, heart, and kidneys. Having untreated or uncontrolled hypertension can lead to: A heart attack. A stroke. A weakened blood vessel (aneurysm). Heart failure. Kidney damage. Eye damage. Memory and concentration problems. Vascular  dementia. What actions can I take to manage this condition? Hypertension can be managed by making lifestyle changes and possibly by taking medicines. Your health care provider will help you make a plan to bring your blood pressure within a normal range. You may be referred for counseling on a healthy diet and physical activity. Nutrition  Eat a diet that is high in fiber and potassium, and low in salt (sodium), added sugar, and fat. An example eating plan is called the DASH diet. DASH stands for Dietary Approaches to Stop Hypertension. To eat this way: Eat plenty of fresh fruits and vegetables. Try to fill one-half of your plate at each meal with fruits and vegetables. Eat whole grains, such as whole-wheat pasta, brown rice, or whole-grain bread. Fill about one-fourth of your plate with whole grains. Eat low-fat dairy products. Avoid fatty cuts of meat, processed or cured meats, and poultry with skin. Fill about one-fourth of your plate with lean proteins such as fish, chicken without skin, beans, eggs, and tofu. Avoid pre-made and processed foods. These tend to be higher in sodium, added sugar, and fat. Reduce your daily sodium intake. Many people with hypertension should eat less than 1,500 mg of sodium a day. Lifestyle  Work with your health care provider to maintain a healthy body weight or to lose weight. Ask what an ideal weight is for you. Get at least 30 minutes of exercise that causes your heart to beat faster (aerobic exercise) most days of the week. Activities may include walking, swimming, or biking. Include exercise to strengthen your muscles (resistance exercise), such as weight lifting, as part of your weekly exercise routine. Try to do these types of exercises for 30 minutes at least 3 days a week. Do   not use any products that contain nicotine or tobacco. These products include cigarettes, chewing tobacco, and vaping devices, such as e-cigarettes. If you need help quitting, ask your  health care provider. Control any long-term (chronic) conditions you have, such as high cholesterol or diabetes. Identify your sources of stress and find ways to manage stress. This may include meditation, deep breathing, or making time for fun activities. Alcohol use Do not drink alcohol if: Your health care provider tells you not to drink. You are pregnant, may be pregnant, or are planning to become pregnant. If you drink alcohol: Limit how much you have to: 0-1 drink a day for women. 0-2 drinks a day for men. Know how much alcohol is in your drink. In the [place] one drink equals one 12 oz bottle of beer (355 mL), one 5 oz glass of wine (148 mL), or one 1 oz glass of hard liquor (44 mL). Medicines Your health care provider may prescribe medicine if lifestyle changes are not enough to get your blood pressure under control and if: Your systolic blood pressure is 130 or higher. Your diastolic blood pressure is 80 or higher. Take medicines only as told by your health care provider. Follow the directions carefully. Blood pressure medicines must be taken as told by your health care provider. The medicine does not work as well when you skip doses. Skipping doses also puts you at risk for problems. Monitoring Before you monitor your blood pressure: Do not smoke, drink caffeinated beverages, or exercise within 30 minutes before taking a measurement. Use the bathroom and empty your bladder (urinate). Sit quietly for at least 5 minutes before taking measurements. Monitor your blood pressure at home as told by your health care provider. To do this: Sit with your back straight and supported. Place your feet flat on the floor. Do not cross your legs. Support your arm on a flat surface, such as a table. Make sure your upper arm is at heart level. Each time you measure, take two or three readings one minute apart and record the results. You may also need to have your blood pressure checked regularly by  your health care provider. General information Talk with your health care provider about your diet, exercise habits, and other lifestyle factors that may be contributing to hypertension. Review all the medicines you take with your health care provider because there may be side effects or interactions. Keep all follow-up visits. Your health care provider can help you create and adjust your plan for managing your high blood pressure. Where to find more information National Heart, Lung, and Blood Institute: www.nhlbi.nih.gov American Heart Association: www.heart.org Contact a health care provider if: You think you are having a reaction to medicines you have taken. You have repeated (recurrent) headaches. You feel dizzy. You have swelling in your ankles. You have trouble with your vision. Get help right away if: You develop a severe headache or confusion. You have unusual weakness or numbness, or you feel faint. You have severe pain in your chest or abdomen. You vomit repeatedly. You have trouble breathing. These symptoms may be an emergency. Get help right away. Call 911. Do not wait to see if the symptoms will go away. Do not drive yourself to the hospital. Summary Hypertension is when the force of blood pumping through your arteries is too strong. If this condition is not controlled, it may put you at risk for serious complications. Your personal target blood pressure may vary depending on your medical conditions,   your age, and other factors. For most people, a normal blood pressure is less than 120/80. Hypertension is managed by lifestyle changes, medicines, or both. Lifestyle changes to help manage hypertension include losing weight, eating a healthy, low-sodium diet, exercising more, stopping smoking, and limiting alcohol. This information is not intended to replace advice given to you by your health care provider. Make sure you discuss any questions you have with your health care  provider. Document Revised: 11/03/2020 Document Reviewed: 11/03/2020 Elsevier Patient Education  2023 Elsevier Inc.  

## 2022-06-04 NOTE — Progress Notes (Signed)
Referral to Cardiology

## 2022-06-04 NOTE — Progress Notes (Signed)
Subjective:  Patient ID: Tara Hernandez, female    DOB: [birth year]  Age: 77 y.o. MRN: [id number]  CC: Annual Exam   HPI Tara Hernandez is a 77 y.o. year old female with a history of hypertension, hyperlipidemia, stage III CKD, PE (in 03/2022), CAD (status post DES to LAD, mid LAD/mid LAD and  mid LCx), ischemic cardiomyopathy (EF 25% with severe right heart failure, A-fib. Last seen by PCP in 2022.  Interval History: In 03/2022 she was hospitalized at [facility] where she was hospitalized with new diagnosis of HFrEF after she presented with bilateral lower extremity edema.  Stress test revealed evidence of ischemic heart disease and she underwent cardiac cath with PCI to proximal LAD, mid LAD, LCx.  Cardioversion with TEE was unsuccessful.   CT chest revealed pleural effusion, multiple pulmonary nodules, TB ruled out.   CT chest from 03/2022, Care Everywhere: IMPRESSION:  1. No significant change in moderate right pleural effusion with adjacent compressive atelectasis, and stable to slightly decreasing mild interstitial pulmonary edema.   2. Redemonstrated diffuse pulmonary micronodules (<34mm). In the setting of calcified mediastinal lymph nodes, these may represent inflammatory process such as sarcoidosis, versus an infection such as miliary TB. Correlate with mycobacterial testing when available and maintain isolation precautions. Additionally, recommend correlation with any outside imaging as available.   3. Right cardiomegaly with reflux of contrast into the hepatic veins, which can be seen with tricuspid regurgitation.     She presents today for an office visit after being under the care of cardiology at [facility] where she had her last follow-up visit in 04/2022.  At that visit Entresto was discontinued due to cough.  She had presented for a TEE/DCCV but this was aborted due to the fact that she was in sinus rhythm.  Endorses adherence with her medications  but is wondering why she is taking so many medications as she would like to come off some of them. Past Medical History:  Diagnosis Date   Gout    Regurgitation and rechewing     Past Surgical History:  Procedure Laterality Date   EYE SURGERY     TUBAL LIGATION  1983    Family History  Problem Relation Age of Onset   Heart disease Father     Social History   Socioeconomic History   Marital status: Divorced    Spouse name: Not on file   Number of children: Not on file   Years of education: Not on file   Highest education level: Not on file  Occupational History   Not on file  Tobacco Use   Smoking status: Never   Smokeless tobacco: Never  Vaping Use   Vaping Use: Never used  Substance and Sexual Activity   Alcohol use: No   Drug use: No   Sexual activity: Not Currently  Other Topics Concern   Not on file  Social History Narrative   Not on file   Social Determinants of Health   Financial Resource Strain: Not on file  Food Insecurity: Not on file  Transportation Needs: Not on file  Physical Activity: Not on file  Stress: Not on file  Social Connections: Not on file    No Known Allergies  Outpatient Medications Prior to Visit  Medication Sig Dispense Refill   amiodarone (PACERONE) 200 MG tablet Take 200 mg by mouth daily.     apixaban (ELIQUIS) 5 MG TABS tablet Take 5 mg by mouth daily.  clopidogrel (PLAVIX) 75 MG tablet Take 75 mg by mouth daily.     FARXIGA 10 MG TABS tablet Take 10 mg by mouth daily.     metoprolol succinate (TOPROL-XL) 25 MG 24 hr tablet Take 25 mg by mouth daily.     pantoprazole (PROTONIX) 40 MG tablet Take 40 mg by mouth every morning.     spironolactone (ALDACTONE) 25 MG tablet Take 25 mg by mouth daily.     famotidine (PEPCID) 20 MG tablet Take 1 tablet (20 mg total) by mouth 2 (two) times daily. (Patient not taking: Reported on 03/23/2020) 60 tablet 5   mupirocin ointment (BACTROBAN) 2 % Apply to small boils as needed. (Patient  not taking: Reported on 06/04/2022) 22 g 1   naproxen sodium (ALEVE) 220 MG tablet Take 220 mg by mouth daily as needed. (Patient not taking: Reported on 06/04/2022)     No facility-administered medications prior to visit.     ROS Review of Systems  Constitutional:  Negative for activity change and appetite change.  HENT:  Negative for sinus pressure and sore throat.   Respiratory:  Negative for chest tightness, shortness of breath and wheezing.   Cardiovascular:  Negative for chest pain and palpitations.  Gastrointestinal:  Negative for abdominal distention, abdominal pain and constipation.  Genitourinary: Negative.   Musculoskeletal: Negative.   Psychiatric/Behavioral:  Negative for behavioral problems and dysphoric mood.     Objective:  BP (!) 148/86   Pulse 79   Ht 5' 3.5" (1.613 m)   Wt 200 lb 3.2 oz (90.8 kg)   SpO2 98%   BMI 34.91 kg/m      06/04/2022    3:54 PM 06/04/2022    3:17 PM 10/27/2020   10:15 AM  BP/Weight  Systolic BP [id number]  Diastolic BP 86 89 83  Wt. (Lbs)  200.2 210.8  BMI  34.91 kg/m2 37.94 kg/m2      Physical Exam Constitutional:      Appearance: She is well-developed.  Neck:     Comments: No JVD Cardiovascular:     Rate and Rhythm: Normal rate.     Heart sounds: Normal heart sounds. No murmur heard. Pulmonary:     Effort: Pulmonary effort is normal.     Breath sounds: Normal breath sounds. No wheezing or rales.  Chest:     Chest wall: No tenderness.  Abdominal:     General: Bowel sounds are normal. There is no distension.     Palpations: Abdomen is soft. There is no mass.     Tenderness: There is no abdominal tenderness.  Musculoskeletal:        General: Normal range of motion.     Right lower leg: No edema.     Left lower leg: No edema.  Neurological:     Mental Status: She is alert and oriented to person, place, and time.  Psychiatric:        Mood and Affect: Mood normal.        Latest Ref Rng & Units 10/27/2020   11:17 AM  07/28/2019   12:00 PM 09/06/2017    3:51 PM  CMP  Glucose 65 - 99 mg/dL 88  86  83   BUN 8 - 27 mg/dL 14  13  17    Creatinine 0.57 - 1.00 mg/dL 0.79  0.83  0.88   Sodium 134 - 144 mmol/L 141  144  144   Potassium 3.5 - 5.2 mmol/L 4.4  3.8  4.0  Chloride 96 - 106 mmol/L 104  107  106   CO2 20 - 29 mmol/L 21  20  21    Calcium 8.7 - 10.3 mg/dL 9.8  9.2  10.1   Total Protein 6.0 - 8.5 g/dL 7.0  7.0  7.5   Total Bilirubin 0.0 - 1.2 mg/dL 0.4  0.5  0.5   Alkaline Phos 44 - 121 IU/L 83  75  59   AST 0 - 40 IU/L 22  21  22    ALT 0 - 32 IU/L 10  7  11      Lipid Panel     Component Value Date/Time   CHOL 200 (H) 10/27/2020 1117   TRIG 68 10/27/2020 1117   HDL 57 10/27/2020 1117   CHOLHDL 3.5 10/27/2020 1117   CHOLHDL 4.0 04/07/2015 1611   VLDL 20 04/07/2015 1611   LDLCALC 131 (H) 10/27/2020 1117    CBC    Component Value Date/Time   WBC 4.9 10/27/2020 1117   WBC 5.8 04/07/2015 1611   RBC 4.30 10/27/2020 1117   RBC 4.68 04/07/2015 1611   HGB 13.2 10/27/2020 1117   HCT 39.1 10/27/2020 1117   PLT 187 10/27/2020 1117   MCV 91 10/27/2020 1117   MCH 30.7 10/27/2020 1117   MCH 29.1 04/07/2015 1611   MCHC 33.8 10/27/2020 1117   MCHC 33.4 04/07/2015 1611   RDW 13.2 10/27/2020 1117   LYMPHSABS 2.1 04/07/2015 1611   MONOABS 0.3 04/07/2015 1611   EOSABS 0.1 04/07/2015 1611   BASOSABS 0.1 04/07/2015 1611    Lab Results  Component Value Date   HGBA1C 5.3 08/31/2016    Assessment & Plan:  1. Hypertensive heart disease with chronic combined systolic and diastolic congestive heart failure Ischemic heart disease with a EF of 25% Unable to tolerate Entresto due to cough per cardiology notes from [facility] on [id number] Continue beta-blocker, spironolactone Counseled on pathophysiology of heart failure and the role of GDMT - Ambulatory referral to Cardiology - CMP14+EGFR - CBC with Differential/Platelet - LP+Non-HDL Cholesterol  2. Mixed hyperlipidemia Continue statin Will  check lipid panel today  3. Atrial fibrillation, unspecified type Currently in sinus rhythm Currently on amiodarone, Eliquis, metoprolol - Ambulatory referral to Cardiology  4. Coronary artery disease involving native heart without angina pectoris, unspecified vessel or lesion type Status post DES to proximal, mid LAD, LCx She remains on Plavix and will likely need to be on it for 12 months till 03/2023 but will defer to cardiology - Ambulatory referral to Cardiology  5. Gouty arthritis of right hand No flares - Uric Acid  6. History of pulmonary embolism Currently on Eliquis From a pulmonary standpoint she has completed her course of anticoagulation but given the need for DOAC in A-fib she will remain on Eliquis   No orders of the defined types were placed in this encounter.   Follow-up: Return in about 3 months (around 09/03/2022) for Medical conditions with PCP.       Charlott Rakes, MD, FAAFP. [facility] [place], [place]   06/04/2022, 6:11 PM

## 2022-06-05 ENCOUNTER — Other Ambulatory Visit: Payer: Self-pay | Admitting: Family Medicine

## 2022-06-05 LAB — CBC WITH DIFFERENTIAL/PLATELET
Basophils Absolute: 0.1 10*3/uL (ref 0.0–0.2)
Basos: 1 %
EOS (ABSOLUTE): 0.1 10*3/uL (ref 0.0–0.4)
Eos: 1 %
Hematocrit: 45.6 % (ref 34.0–46.6)
Hemoglobin: 14.9 g/dL (ref 11.1–15.9)
Immature Grans (Abs): 0 10*3/uL (ref 0.0–0.1)
Immature Granulocytes: 1 %
Lymphocytes Absolute: 1.1 10*3/uL (ref 0.7–3.1)
Lymphs: 26 %
MCH: 29 pg (ref 26.6–33.0)
MCHC: 32.7 g/dL (ref 31.5–35.7)
MCV: 89 fL (ref 79–97)
Monocytes Absolute: 0.3 10*3/uL (ref 0.1–0.9)
Monocytes: 7 %
Neutrophils Absolute: 2.7 10*3/uL (ref 1.4–7.0)
Neutrophils: 64 %
Platelets: 204 10*3/uL (ref 150–450)
RBC: 5.14 x10E6/uL (ref 3.77–5.28)
RDW: 17.8 % — ABNORMAL HIGH (ref 11.7–15.4)
WBC: 4.3 10*3/uL (ref 3.4–10.8)

## 2022-06-05 LAB — CMP14+EGFR
ALT: 14 IU/L (ref 0–32)
AST: 27 IU/L (ref 0–40)
Albumin/Globulin Ratio: 1.4 (ref 1.2–2.2)
Albumin: 4.3 g/dL (ref 3.8–4.8)
Alkaline Phosphatase: 70 IU/L (ref 44–121)
BUN/Creatinine Ratio: 21 (ref 12–28)
BUN: 18 mg/dL (ref 8–27)
Bilirubin Total: 0.5 mg/dL (ref 0.0–1.2)
CO2: 22 mmol/L (ref 20–29)
Calcium: 9.7 mg/dL (ref 8.7–10.3)
Chloride: 104 mmol/L (ref 96–106)
Creatinine, Ser: 0.87 mg/dL (ref 0.57–1.00)
Globulin, Total: 3.1 g/dL (ref 1.5–4.5)
Glucose: 90 mg/dL (ref 70–99)
Potassium: 4.3 mmol/L (ref 3.5–5.2)
Sodium: 141 mmol/L (ref 134–144)
Total Protein: 7.4 g/dL (ref 6.0–8.5)
eGFR: 69 mL/min/{1.73_m2} (ref >59)

## 2022-06-05 LAB — LP+NON-HDL CHOLESTEROL
Cholesterol, Total: 243 mg/dL — ABNORMAL HIGH (ref 100–199)
HDL: 70 mg/dL (ref >39)
LDL Chol Calc (NIH): 160 mg/dL — ABNORMAL HIGH (ref 0–99)
Total Non-HDL-Chol (LDL+VLDL): 173 mg/dL — ABNORMAL HIGH (ref 0–129)
Triglycerides: 77 mg/dL (ref 0–149)
VLDL Cholesterol Cal: 13 mg/dL (ref 5–40)

## 2022-06-05 LAB — URIC ACID: Uric Acid: 6.1 mg/dL (ref 3.1–7.9)

## 2022-06-05 MED ORDER — ATORVASTATIN CALCIUM 40 MG PO TABS: 40.0000 mg | ORAL_TABLET | Freq: Every day | ORAL | 1 refills | Status: AC

## 2022-07-03 ENCOUNTER — Ambulatory Visit: Payer: 59 | Attending: Internal Medicine | Admitting: Internal Medicine

## 2022-07-03 ENCOUNTER — Encounter: Payer: Self-pay | Admitting: Internal Medicine

## 2022-07-03 VITALS — BP 144/82 | HR 66 | Ht 63.5 in | Wt 201.8 lb

## 2022-07-03 DIAGNOSIS — I5022 Chronic systolic (congestive) heart failure: Secondary | ICD-10-CM | POA: Diagnosis not present

## 2022-07-03 DIAGNOSIS — Z79899 Other long term (current) drug therapy: Secondary | ICD-10-CM

## 2022-07-03 LAB — TSH: TSH: 3.56 u[IU]/mL (ref 0.450–4.500)

## 2022-07-03 NOTE — Progress Notes (Signed)
Cardiology Office Note:    Date:  07/03/2022   ID:  Rachele Lamaster, DOB May 22, 1945, MRN [id number]  PCP:  Hoy Register, MD   [facility] Providers Cardiologist:  Maisie Fus, MD     Referring MD: Hoy Register, MD   No chief complaint on file. Hospital FU  History of Present Illness:    Tara Hernandez is a 77 y.o. female with a hx of  history of hypertension, hyperlipidemia, PE (in 03/2022), CAD (status post DES to LAD, mid LAD/mid LAD and  mid LCx), ischemic cardiomyopathy (EF 25% with severe right heart failure-EF has recovered, A-fib.   She was hospitalized in January 2024 at [facility]. She p/w bilateral lower extremity swelling . Noted TTE is suggesting heart failure with reduced ejection fraction (25%) with severe right heart failure. PET stress with evidence of ischemic heart disease. Cath 1/19,, unsuccessful cardioversion with TEE 1/19. PCI to proximal LAD, mid-LAD, and LCX 1/23.  Cath was c/b Right inguinal hematoma surrounding asymmetrically prominent and mildly irregular medial Coyle Stordahl of the proximal superficial femoral artery.  She was started on GDMT.   Today, she is here to establish care. Her family lives in [place]. She lives here. She denies LE edema. He denies angina, dyspnea on exertion, lower extremity edema, PND or orthopnea. She does some walking. Her weight is 201. Bps 120s at home, and even lower. No Dm2. Non smoker.   Past Medical History:  Diagnosis Date   Gout    Regurgitation and rechewing     Past Surgical History:  Procedure Laterality Date   EYE SURGERY     TUBAL LIGATION  1983    Current Medications: Current Outpatient Medications on File Prior to Visit  Medication Sig Dispense Refill   amiodarone (PACERONE) 200 MG tablet Take 200 mg by mouth daily.     apixaban (ELIQUIS) 5 MG TABS tablet Take 5 mg by mouth daily.     atorvastatin (LIPITOR) 40 MG tablet Take 1 tablet (40 mg total) by mouth daily. (Patient not taking: Reported on  07/03/2022) 90 tablet 1   clopidogrel (PLAVIX) 75 MG tablet Take 75 mg by mouth daily.     FARXIGA 10 MG TABS tablet Take 10 mg by mouth daily.     metoprolol succinate (TOPROL-XL) 25 MG 24 hr tablet Take 25 mg by mouth daily.     mupirocin ointment (BACTROBAN) 2 % Apply to small boils as needed. 22 g 1   naproxen sodium (ALEVE) 220 MG tablet Take 220 mg by mouth daily as needed.     pantoprazole (PROTONIX) 40 MG tablet Take 40 mg by mouth every morning.     spironolactone (ALDACTONE) 25 MG tablet Take 25 mg by mouth daily.     No current facility-administered medications on file prior to visit.     Allergies:   Patient has no known allergies.   Social History   Socioeconomic History   Marital status: Divorced    Spouse name: Not on file   Number of children: Not on file   Years of education: Not on file   Highest education level: Not on file  Occupational History   Not on file  Tobacco Use   Smoking status: Never   Smokeless tobacco: Never  Vaping Use   Vaping Use: Never used  Substance and Sexual Activity   Alcohol use: No   Drug use: No   Sexual activity: Not Currently  Other Topics Concern   Not on file  Social  History Narrative   Not on file   Social Determinants of Health   Financial Resource Strain: Not on file  Food Insecurity: Not on file  Transportation Needs: Not on file  Physical Activity: Not on file  Stress: Not on file  Social Connections: Not on file     Family History: The patient's family history includes Heart disease in her father. Father had ?MI  ROS:   Please see the history of present illness.     All other systems reviewed and are negative.  EKGs/Labs/Other Studies Reviewed:    The following studies were reviewed today:   EKG:  EKG is  ordered today.  The ekg ordered today demonstrates   06/23/2022- NSR  LHC 03/27/2022 Angiogram details: LM: 20% distal stenosis LAD: diffusely disease with 30% proximal stenosis, 80-90% mid-vessel   stenosis LCX: 70% ostial stenosis, mid-vessel, OM1 with 50% proximal stenosis,  luminal irregularities otherwise RCA: 100% mid-vessel CTO; vessel fills via L to R epicardial collaterals  from distal LAD, there is a separate occlusion in the PDA LVEDP: LV-gram: LVEF 25-30%  Impression: - multi-vessel coronary artery disease - RCA CTO fills via L to R collaterals - significant mid-LAD disease    TTE 04/08/2015 - Left ventricle: The cavity size was normal. Wall thickness was    normal. Systolic function was normal. The estimated ejection    fraction was in the range of 55% to 60%. Wall motion was normal;  there were no regional wall motion abnormalities. There was no    evidence of elevated ventricular filling pressure by Doppler    parameters. Global longitudinal strain: -19.7%  - normal RV size and function - Mitral valve: There was mild regurgitation.    Recent Labs: 06/04/2022: ALT 14; BUN 18; Creatinine, Ser 0.87; Hemoglobin 14.9; Platelets 204; Potassium 4.3; Sodium 141  Recent Lipid Panel    Component Value Date/Time   CHOL 243 (H) 06/04/2022 1603   TRIG 77 06/04/2022 1603   HDL 70 06/04/2022 1603   CHOLHDL 3.5 10/27/2020 1117   CHOLHDL 4.0 04/07/2015 1611   VLDL 20 04/07/2015 1611   LDLCALC 160 (H) 06/04/2022 1603     Risk Assessment/Calculations:    CHA2DS2-VASc Score = 6   This indicates a 9.7% annual risk of stroke. The patient's score is based upon: CHF History: 1 HTN History: 1 Diabetes History: 0 Stroke History: 0 Vascular Disease History: 1 Age Score: 2 Gender Score: 1       Physical Exam:    VS:  BP (!) 144/82 (BP Location: Right Arm, Patient Position: Lying right side, Cuff Size: Large)   Pulse 66   Ht 5' 3.5" (1.613 m)   Wt 201 lb 12.8 oz (91.5 kg)   SpO2 97%   BMI 35.19 kg/m     Wt Readings from Last 3 Encounters:  07/03/22 201 lb 12.8 oz (91.5 kg)  06/04/22 200 lb 3.2 oz (90.8 kg)  10/27/20 210 lb 12.8 oz (95.6 kg)     GEN:   Well nourished, well developed in no acute distress HEENT: Normal NECK: No JVD; No carotid bruits CARDIAC: RRR, no murmurs, rubs, gallops RESPIRATORY:  Clear to auscultation without rales, wheezing or rhonchi  ABDOMEN: Soft, non-tender, non-distended MUSCULOSKELETAL:  No edema; No deformity  SKIN: Warm and dry NEUROLOGIC:  Alert and oriented x 3 PSYCHIATRIC:  Normal affect   ASSESSMENT:    Ischemic Cardiomyopathy NYHA II: Ischemic CM, mid LAD/Lcx. s/p IV iron.  - repeat TTE - euvolemic ,  drt wt ~ 201 - continue farxiga 10 mg daily - continue spironolactone 25 mg daily - continue metop 25 mg XL daily  - entresto 24-26 mg BID  CAD:  s/p DES to the mid LAD and Mid Lcx - on plavix and eliquis 5 mg BID  Paroxysmal Atrial Fibrillation: had an unsuccessful DCCV on 03/23/2022. Placed on amiodarone. - continue amiodarone; TSH today, liver fxn nl, no signs of pulmonary dx - continue AC   RLL PE: on AC PLAN:    In order of problems listed above:  TTE TSH Follow up 6 months      Medication Adjustments/Labs and Tests Ordered: Current medicines are reviewed at length with the patient today.  Concerns regarding medicines are outlined above.  Orders Placed This Encounter  Procedures   TSH   EKG 12-Lead   ECHOCARDIOGRAM COMPLETE   No orders of the defined types were placed in this encounter.   Patient Instructions  Medication Instructions:   Your physician recommends that you continue on your current medications as directed. Please refer to the Current Medication list given to you today.  *If you need a refill on your cardiac medications before your next appointment, please call your pharmacy*  Lab Work: Your physician recommends that you have  lab work TODAY:  TSH  If you have labs (blood work) drawn today and your tests are completely normal, you will receive your results only by: MyChart Message (if you have MyChart) OR A paper copy in the mail If you have any lab  test that is abnormal or we need to change your treatment, we will call you to review the results.  Testing/Procedures: Your physician has requested that you have an echocardiogram. Echocardiography is a painless test that uses sound waves to create images of your heart. It provides your doctor with information about the size and shape of your heart and how well your heart's chambers and valves are working. This procedure takes approximately one hour. There are no restrictions for this procedure. Please do NOT wear cologne, perfume, aftershave, or lotions (deodorant is allowed). Please arrive 15 minutes prior to your appointment time.  Follow-Up: At [facility], you and your health needs are our priority.  As part of our continuing mission to provide you with exceptional heart care, we have created designated Provider Care Teams.  These Care Teams include your primary Cardiologist (physician) and Advanced Practice Providers (APPs -  Physician Assistants and Nurse Practitioners) who all work together to provide you with the care you need, when you need it.  Your next appointment:   6 month(s)  Provider:   Maisie Fus, MD     Other Instructions    Signed, Maisie Fus, MD  07/03/2022 10:53 AM    [facility]

## 2022-07-03 NOTE — Patient Instructions (Addendum)
Medication Instructions:   Your physician recommends that you continue on your current medications as directed. Please refer to the Current Medication list given to you today.  *If you need a refill on your cardiac medications before your next appointment, please call your pharmacy*  Lab Work: Your physician recommends that you have  lab work TODAY:  TSH  If you have labs (blood work) drawn today and your tests are completely normal, you will receive your results only by: MyChart Message (if you have MyChart) OR A paper copy in the mail If you have any lab test that is abnormal or we need to change your treatment, we will call you to review the results.  Testing/Procedures: Your physician has requested that you have an echocardiogram. Echocardiography is a painless test that uses sound waves to create images of your heart. It provides your doctor with information about the size and shape of your heart and how well your heart's chambers and valves are working. This procedure takes approximately one hour. There are no restrictions for this procedure. Please do NOT wear cologne, perfume, aftershave, or lotions (deodorant is allowed). Please arrive 15 minutes prior to your appointment time.  Follow-Up: At [facility], you and your health needs are our priority.  As part of our continuing mission to provide you with exceptional heart care, we have created designated Provider Care Teams.  These Care Teams include your primary Cardiologist (physician) and Advanced Practice Providers (APPs -  Physician Assistants and Nurse Practitioners) who all work together to provide you with the care you need, when you need it.  Your next appointment:   6 month(s)  Provider:   Maisie Fus, MD     Other Instructions

## 2022-07-30 NOTE — Patient Instructions (Incomplete)
[name] , Thank you for taking time to come for your Medicare Wellness Visit. I appreciate your ongoing commitment to your health goals. Please review the following plan we discussed and let me know if I can assist you in the future.   These are the goals we discussed:  Goals   None     This is a list of the screening recommended for you and due dates:  Health Maintenance  Topic Date Due   COVID-19 Vaccine (1) Never done   DTaP/Tdap/Td vaccine (1 - Tdap) Never done   Zoster (Shingles) Vaccine (1 of 2) Never done   Pneumonia Vaccine (1 of 1 - PCV) Never done   Medicare Annual Wellness Visit  10/27/2021   Flu Shot  10/04/2022   DEXA scan (bone density measurement)  Completed   Hepatitis C Screening  Completed   HPV Vaccine  Aged Out   Cologuard (Stool DNA test)  Discontinued    Advanced directives: Information on Advanced Care Planning can be found at [facility] Advance Health Care Directives Advance Health Care Directives ([email or web address]  Please bring a copy of your health care power of attorney and living will to the office to be added to your chart at your convenience.   Conditions/risks identified: Aim for 30 minutes of exercise or brisk walking, 6-8 glasses of water, and 5 servings of fruits and vegetables each day.  Next appointment: Follow up in one year for your annual wellness visit    Preventive Care 65 Years and Older, Female Preventive care refers to lifestyle choices and visits with your health care provider that can promote health and wellness. What does preventive care include? A yearly physical exam. This is also called an annual well check. Dental exams once or twice a year. Routine eye exams. Ask your health care provider how often you should have your eyes checked. Personal lifestyle choices, including: Daily care of your teeth and gums. Regular physical activity. Eating a healthy diet. Avoiding tobacco and drug use. Limiting alcohol  use. Practicing safe sex. Taking low-dose aspirin every day. Taking vitamin and mineral supplements as recommended by your health care provider. What happens during an annual well check? The services and screenings done by your health care provider during your annual well check will depend on your age, overall health, lifestyle risk factors, and family history of disease. Counseling  Your health care provider may ask you questions about your: Alcohol use. Tobacco use. Drug use. Emotional well-being. Home and relationship well-being. Sexual activity. Eating habits. History of falls. Memory and ability to understand (cognition). Work and work Astronomer. Reproductive health. Screening  You may have the following tests or measurements: Height, weight, and BMI. Blood pressure. Lipid and cholesterol levels. These may be checked every 5 years, or more frequently if you are over 40 years old. Skin check. Lung cancer screening. You may have this screening every year starting at age 43 if you have a 30-pack-year history of smoking and currently smoke or have quit within the past 15 years. Fecal occult blood test (FOBT) of the stool. You may have this test every year starting at age 64. Flexible sigmoidoscopy or colonoscopy. You may have a sigmoidoscopy every 5 years or a colonoscopy every 10 years starting at age 38. Hepatitis C blood test. Hepatitis B blood test. Sexually transmitted disease (STD) testing. Diabetes screening. This is done by checking your blood sugar (glucose) after you have not eaten for a while (fasting). You may  have this done every 1-3 years. Bone density scan. This is done to screen for osteoporosis. You may have this done starting at age 46. Mammogram. This may be done every 1-2 years. Talk to your health care provider about how often you should have regular mammograms. Talk with your health care provider about your test results, treatment options, and if necessary,  the need for more tests. Vaccines  Your health care provider may recommend certain vaccines, such as: Influenza vaccine. This is recommended every year. Tetanus, diphtheria, and acellular pertussis (Tdap, Td) vaccine. You may need a Td booster every 10 years. Zoster vaccine. You may need this after age 69. Pneumococcal 13-valent conjugate (PCV13) vaccine. One dose is recommended after age 5. Pneumococcal polysaccharide (PPSV23) vaccine. One dose is recommended after age 43. Talk to your health care provider about which screenings and vaccines you need and how often you need them. This information is not intended to replace advice given to you by your health care provider. Make sure you discuss any questions you have with your health care provider. Document Released: 03/18/2015 Document Revised: 11/09/2015 Document Reviewed: 12/21/2014 Elsevier Interactive Patient Education  2017 [employer].  Fall Prevention in the Home Falls can cause injuries. They can happen to people of all ages. There are many things you can do to make your home safe and to help prevent falls. What can I do on the outside of my home? Regularly fix the edges of walkways and driveways and fix any cracks. Remove anything that might make you trip as you walk through a door, such as a raised step or threshold. Trim any bushes or trees on the path to your home. Use bright outdoor lighting. Clear any walking paths of anything that might make someone trip, such as rocks or tools. Regularly check to see if handrails are loose or broken. Make sure that both sides of any steps have handrails. Any raised decks and porches should have guardrails on the edges. Have any leaves, snow, or ice cleared regularly. Use sand or salt on walking paths during winter. Clean up any spills in your garage right away. This includes oil or grease spills. What can I do in the bathroom? Use night lights. Install grab bars by the toilet and in the  tub and shower. Do not use towel bars as grab bars. Use non-skid mats or decals in the tub or shower. If you need to sit down in the shower, use a plastic, non-slip stool. Keep the floor dry. Clean up any water that spills on the floor as soon as it happens. Remove soap buildup in the tub or shower regularly. Attach bath mats securely with double-sided non-slip rug tape. Do not have throw rugs and other things on the floor that can make you trip. What can I do in the bedroom? Use night lights. Make sure that you have a light by your bed that is easy to reach. Do not use any sheets or blankets that are too big for your bed. They should not hang down onto the floor. Have a firm chair that has side arms. You can use this for support while you get dressed. Do not have throw rugs and other things on the floor that can make you trip. What can I do in the kitchen? Clean up any spills right away. Avoid walking on wet floors. Keep items that you use a lot in easy-to-reach places. If you need to reach something above you, use a strong step  stool that has a grab bar. Keep electrical cords out of the way. Do not use floor polish or wax that makes floors slippery. If you must use wax, use non-skid floor wax. Do not have throw rugs and other things on the floor that can make you trip. What can I do with my stairs? Do not leave any items on the stairs. Make sure that there are handrails on both sides of the stairs and use them. Fix handrails that are broken or loose. Make sure that handrails are as long as the stairways. Check any carpeting to make sure that it is firmly attached to the stairs. Fix any carpet that is loose or worn. Avoid having throw rugs at the top or bottom of the stairs. If you do have throw rugs, attach them to the floor with carpet tape. Make sure that you have a light switch at the top of the stairs and the bottom of the stairs. If you do not have them, ask someone to add them for  you. What else can I do to help prevent falls? Wear shoes that: Do not have high heels. Have rubber bottoms. Are comfortable and fit you well. Are closed at the toe. Do not wear sandals. If you use a stepladder: Make sure that it is fully opened. Do not climb a closed stepladder. Make sure that both sides of the stepladder are locked into place. Ask someone to hold it for you, if possible. Clearly mark and make sure that you can see: Any grab bars or handrails. First and last steps. Where the edge of each step is. Use tools that help you move around (mobility aids) if they are needed. These include: Canes. Walkers. Scooters. Crutches. Turn on the lights when you go into a dark area. Replace any light bulbs as soon as they burn out. Set up your furniture so you have a clear path. Avoid moving your furniture around. If any of your floors are uneven, fix them. If there are any pets around you, be aware of where they are. Review your medicines with your doctor. Some medicines can make you feel dizzy. This can increase your chance of falling. Ask your doctor what other things that you can do to help prevent falls. This information is not intended to replace advice given to you by your health care provider. Make sure you discuss any questions you have with your health care provider. Document Released: 12/16/2008 Document Revised: 07/28/2015 Document Reviewed: 03/26/2014 Elsevier Interactive Patient Education  2017 [employer].

## 2022-07-30 NOTE — Progress Notes (Unsigned)
Subjective:   Tara Hernandez is a 77 y.o. female who presents for Medicare Annual (Subsequent) preventive examination.  Review of Systems    ***       Objective:    There were no vitals filed for this visit. There is no height or weight on file to calculate BMI.     10/27/2020   10:14 AM 10/04/2016   11:06 AM 08/31/2016   11:21 AM 04/07/2015    3:17 PM 03/25/2014    2:59 PM  Advanced Directives  Does Patient Have a Medical Advance Directive? No No No No No  Would patient like information on creating a medical advance directive? Yes (Inpatient - patient defers creating a medical advance directive at this time - Information given) No - Patient declined No - Patient declined No - patient declined information No - patient declined information    Current Medications (verified) Outpatient Encounter Medications as of 07/31/2022  Medication Sig   atorvastatin (LIPITOR) 40 MG tablet Take 1 tablet (40 mg total) by mouth daily. (Patient not taking: Reported on 07/03/2022)   amiodarone (PACERONE) 200 MG tablet Take 200 mg by mouth daily.   apixaban (ELIQUIS) 5 MG TABS tablet Take 5 mg by mouth daily.   clopidogrel (PLAVIX) 75 MG tablet Take 75 mg by mouth daily.   FARXIGA 10 MG TABS tablet Take 10 mg by mouth daily.   metoprolol succinate (TOPROL-XL) 25 MG 24 hr tablet Take 25 mg by mouth daily.   mupirocin ointment (BACTROBAN) 2 % Apply to small boils as needed.   naproxen sodium (ALEVE) 220 MG tablet Take 220 mg by mouth daily as needed.   pantoprazole (PROTONIX) 40 MG tablet Take 40 mg by mouth every morning.   spironolactone (ALDACTONE) 25 MG tablet Take 25 mg by mouth daily.   No facility-administered encounter medications on file as of 07/31/2022.    Allergies (verified) Patient has no known allergies.   History: Past Medical History:  Diagnosis Date   Gout    Regurgitation and rechewing    Past Surgical History:  Procedure Laterality Date   EYE SURGERY     TUBAL LIGATION   1983   Family History  Problem Relation Age of Onset   Heart disease Father    Social History   Socioeconomic History   Marital status: Divorced    Spouse name: Not on file   Number of children: Not on file   Years of education: Not on file   Highest education level: Not on file  Occupational History   Not on file  Tobacco Use   Smoking status: Never   Smokeless tobacco: Never  Vaping Use   Vaping Use: Never used  Substance and Sexual Activity   Alcohol use: No   Drug use: No   Sexual activity: Not Currently  Other Topics Concern   Not on file  Social History Narrative   Not on file   Social Determinants of Health   Financial Resource Strain: Not on file  Food Insecurity: Not on file  Transportation Needs: Not on file  Physical Activity: Not on file  Stress: Not on file  Social Connections: Not on file    Tobacco Counseling Counseling given: Not Answered   Clinical Intake:              How often do you need to have someone help you when you read instructions, pamphlets, or other written materials from your doctor or pharmacy?: (P) 1 - Never  Diabetic?No          Activities of Daily Living    07/29/2022   10:42 AM  In your present state of health, do you have any difficulty performing the following activities:  Hearing? 0  Vision? 0  Difficulty concentrating or making decisions? 0  Walking or climbing stairs? 0  Dressing or bathing? 0  Doing errands, shopping? 0  Preparing Food and eating ? N  In the past six months, have you accidently leaked urine? Y  Do you have problems with loss of bowel control? N  Managing your Medications? N  Managing your Finances? N  Housekeeping or managing your Housekeeping? N    Patient Care Team: Hoy Register, MD as PCP - General (Family Medicine) Maisie Fus, MD as PCP - Cardiology (Cardiology)  Indicate any recent Medical Services you may have received from other than [facility] providers in the past  year (date may be approximate).     Assessment:   This is a routine wellness examination for Tara Hernandez.  Hearing/Vision screen No results found.  Dietary issues and exercise activities discussed:     Goals Addressed   None    Depression Screen    10/27/2020   10:14 AM 07/28/2019   10:16 AM 09/06/2017    2:45 PM 10/04/2016   11:07 AM 08/31/2016   11:21 AM 04/07/2015    3:17 PM 03/25/2014    2:59 PM  PHQ 2/9 Scores  PHQ - 2 Score 0 0 0 0 0 0 0    Fall Risk    07/29/2022   10:42 AM 06/04/2022    3:16 PM 10/27/2020   10:14 AM 07/28/2019   10:16 AM 09/06/2017    2:45 PM  Fall Risk   Falls in the past year? 0 0 0 0 No  Number falls in past yr:  0 0    Injury with Fall?  0 0    Risk for fall due to :  No Fall Risks No Fall Risks      FALL RISK PREVENTION PERTAINING TO THE HOME:  Any stairs in or around the home? {YES/NO:21197} If so, are there any without handrails? {YES/NO:21197} Home free of loose throw rugs in walkways, pet beds, electrical cords, etc? {YES/NO:21197} Adequate lighting in your home to reduce risk of falls? {YES/NO:21197}  ASSISTIVE DEVICES UTILIZED TO PREVENT FALLS:  Life alert? {YES/NO:21197} Use of a cane, walker or w/c? {YES/NO:21197} Grab bars in the bathroom? {YES/NO:21197} Shower chair or bench in shower? {YES/NO:21197} Elevated toilet seat or a handicapped toilet? {YES/NO:21197}  TIMED UP AND GO:  Was the test performed? No . Telephonic visit   Cognitive Function:    10/27/2020   10:13 AM  MMSE - Mini Mental State Exam  Orientation to time 5  Orientation to Place 5  Registration 3  Attention/ Calculation 5  Recall 3  Language- name 2 objects 2  Language- repeat 1  Language- follow 3 step command 3  Language- read & follow direction 1  Write a sentence 1  Copy design 0  Total score 29        Immunizations  There is no immunization history on file for this patient.  {TDAP status:2101805}  {Pneumococcal vaccine  status:2101807}  Covid-19 vaccine status: Information provided on how to obtain vaccines.   Qualifies for Shingles Vaccine? Yes   Zostavax completed No   Shingrix Completed?: No.    Education has been provided regarding the importance of this vaccine. Patient has been  advised to call insurance company to determine out of pocket expense if they have not yet received this vaccine. Advised may also receive vaccine at local pharmacy or Health Dept. Verbalized acceptance and understanding.  Screening Tests Health Maintenance  Topic Date Due   COVID-19 Vaccine (1) Never done   DTaP/Tdap/Td (1 - Tdap) Never done   Zoster Vaccines- Shingrix (1 of 2) Never done   Pneumonia Vaccine 31+ Years old (1 of 1 - PCV) Never done   Medicare Annual Wellness (AWV)  10/27/2021   INFLUENZA VACCINE  10/04/2022   DEXA SCAN  Completed   Hepatitis C Screening  Completed   HPV VACCINES  Aged Out   Fecal DNA (Cologuard)  Discontinued    Health Maintenance  Health Maintenance Due  Topic Date Due   COVID-19 Vaccine (1) Never done   DTaP/Tdap/Td (1 - Tdap) Never done   Zoster Vaccines- Shingrix (1 of 2) Never done   Pneumonia Vaccine 64+ Years old (1 of 1 - PCV) Never done   Medicare Annual Wellness (AWV)  10/27/2021    Colorectal cancer screening: No longer required.   Mammogram status: No longer required due to age and preference.  Bone Density status: Completed 11/26/17. Results reflect: Bone density results: NORMAL. Repeat every 5 years.  Lung Cancer Screening: (Low Dose CT Chest recommended if Age 43-80 years, 30 pack-year currently smoking OR have quit w/in 15years.) does not qualify.   Lung Cancer Screening Referral: n/a  Additional Screening:  Hepatitis C Screening: does qualify; Completed 07/28/19  Vision Screening: Recommended annual ophthalmology exams for early detection of glaucoma and other disorders of the eye. Is the patient up to date with their annual eye exam?  {YES/NO:21197} Who  is the provider or what is the name of the office in which the patient attends annual eye exams? *** If pt is not established with a provider, would they like to be referred to a provider to establish care? {YES/NO:21197}.   Dental Screening: Recommended annual dental exams for proper oral hygiene  Community Resource Referral / Chronic Care Management: CRR required this visit?  {YES/NO:21197}  CCM required this visit?  {YES/NO:21197}     Plan:     I have personally reviewed and noted the following in the patient's chart:   Medical and social history Use of alcohol, tobacco or illicit drugs  Current medications and supplements including opioid prescriptions. {Opioid [phone]} Functional ability and status Nutritional status Physical activity Advanced directives List of other physicians Hospitalizations, surgeries, and ER visits in previous 12 months Vitals Screenings to include cognitive, depression, and falls Referrals and appointments  In addition, I have reviewed and discussed with patient certain preventive protocols, quality metrics, and best practice recommendations. A written personalized care plan for preventive services as well as general preventive health recommendations were provided to patient.     Durwin Nora, [place]   06/11/8117   Due to this being a virtual visit, the after visit summary with patients personalized plan was offered to patient via mail or my-chart. ***Patient declined at this time./ Patient would like to access on my-chart/ per request, patient was mailed a copy of AVS./ Patient preferred to pick up at office at next visit  Nurse Notes: ***

## 2022-07-31 ENCOUNTER — Ambulatory Visit: Payer: 59 | Attending: Family Medicine

## 2022-07-31 VITALS — Ht 63.5 in | Wt 201.0 lb

## 2022-07-31 DIAGNOSIS — Z0000 Encounter for general adult medical examination without abnormal findings: Secondary | ICD-10-CM | POA: Diagnosis not present

## 2022-08-02 ENCOUNTER — Ambulatory Visit (HOSPITAL_COMMUNITY): Payer: 59 | Attending: Cardiology

## 2022-08-02 DIAGNOSIS — I5022 Chronic systolic (congestive) heart failure: Secondary | ICD-10-CM | POA: Diagnosis not present

## 2022-08-02 LAB — ECHOCARDIOGRAM COMPLETE
Area-P 1/2: 2.99 cm2
MV M vel: 5.64 m/s
MV Peak grad: 127 mmHg
S' Lateral: 3.3 cm

## 2022-09-25 ENCOUNTER — Ambulatory Visit: Payer: 59 | Attending: Family Medicine | Admitting: Family Medicine

## 2022-09-25 VITALS — BP 155/83 | HR 83 | Temp 98.9°F | Ht 63.0 in | Wt 207.8 lb

## 2022-09-25 DIAGNOSIS — Z131 Encounter for screening for diabetes mellitus: Secondary | ICD-10-CM | POA: Diagnosis not present

## 2022-09-25 DIAGNOSIS — I5042 Chronic combined systolic (congestive) and diastolic (congestive) heart failure: Secondary | ICD-10-CM | POA: Diagnosis not present

## 2022-09-25 DIAGNOSIS — M1731 Unilateral post-traumatic osteoarthritis, right knee: Secondary | ICD-10-CM

## 2022-09-25 DIAGNOSIS — Z13228 Encounter for screening for other metabolic disorders: Secondary | ICD-10-CM | POA: Diagnosis not present

## 2022-09-25 DIAGNOSIS — K5909 Other constipation: Secondary | ICD-10-CM

## 2022-09-25 DIAGNOSIS — Z91148 Patient's other noncompliance with medication regimen for other reason: Secondary | ICD-10-CM

## 2022-09-25 DIAGNOSIS — I2510 Atherosclerotic heart disease of native coronary artery without angina pectoris: Secondary | ICD-10-CM

## 2022-09-25 DIAGNOSIS — I4891 Unspecified atrial fibrillation: Secondary | ICD-10-CM

## 2022-09-25 DIAGNOSIS — I110 Hypertensive heart disease with heart failure: Secondary | ICD-10-CM | POA: Diagnosis not present

## 2022-09-25 DIAGNOSIS — R350 Frequency of micturition: Secondary | ICD-10-CM | POA: Diagnosis not present

## 2022-09-25 LAB — POCT URINALYSIS DIP (CLINITEK)
Bilirubin, UA: NEGATIVE
Glucose, UA: NEGATIVE mg/dL
Ketones, POC UA: NEGATIVE mg/dL
Nitrite, UA: NEGATIVE
POC PROTEIN,UA: 100 — AB
Spec Grav, UA: 1.02 (ref 1.010–1.025)
Urobilinogen, UA: 0.2 E.U./dL
pH, UA: 6 (ref 5.0–8.0)

## 2022-09-25 NOTE — Patient Instructions (Signed)

## 2022-09-25 NOTE — Progress Notes (Signed)
Discuss all medications and side effects Frequent urination and constipation

## 2022-09-25 NOTE — Progress Notes (Signed)
Subjective:  Patient ID: Tara Hernandez, female    DOB: Apr 21, 1945  Age: 77 y.o. MRN: [id number]  CC: Hypertension   HPI Tara Hernandez is a 77 y.o. year old female with a history of hypertension, hyperlipidemia, PE (in 03/2022), CAD (status post DES to LAD, mid LAD/mid LAD and  mid LCx), ischemic cardiomyopathy (EF 50 to 55% from 07/2022 improved from previous echo with a EF of 25% with severe right heart failure, A-fib.   Since her last visit she has been seen by cardiology with her visit on 07/03/2022 and notes reviewed.  Sherryll Burger was restarted, echo ordered with plans to follow-up in 6 months. Echo revealed EF of 50 to 55%, LV global hypokinesis, grade 2 DD, mildly dilated left atrium, moderate MVR.  Interval History: Discussed the use of AI scribe software for clinical note transcription with the patient, who gave verbal consent to proceed.  She presents with multiple complaints including constipation, frequent urination, dry mouth, and red eyes. She attributes these symptoms to their medications and has stopped taking all medications as a result as they also made her depressed.  The patient reports that her symptoms have mostly resolved since discontinuing her medications a couple of weeks ago. She also reports a change in diet, incorporating more fruits, vegetables, and fiber, in an attempt to manage her constipation. Despite these changes, the patient continues to experience constipation and frequent urination. The patient expresses a desire to pursue a holistic approach to her health and is interested in exploring dietary changes and natural remedies. She also reports concerns about her liver health and has requested blood work to assess her liver function.     She has no dyspnea, no pedal edema, no orthopnea or chest pain.   Past Medical History:  Diagnosis Date   Arthritis    Cataract    Gout    Regurgitation and rechewing     Past Surgical History:  Procedure Laterality Date    EYE SURGERY     TUBAL LIGATION  03/05/1981    Family History  Problem Relation Age of Onset   Heart disease Father     Social History   Socioeconomic History   Marital status: Divorced    Spouse name: Not on file   Number of children: Not on file   Years of education: Not on file   Highest education level: Associate degree: occupational, Scientist, product/process development, or vocational program  Occupational History   Not on file  Tobacco Use   Smoking status: Never   Smokeless tobacco: Never  Vaping Use   Vaping status: Never Used  Substance and Sexual Activity   Alcohol use: No   Drug use: No   Sexual activity: Not Currently    Birth control/protection: Abstinence, None  Other Topics Concern   Not on file  Social History Narrative   Not on file   Social Determinants of Health   Financial Resource Strain: Low Risk  (09/24/2022)   Overall Financial Resource Strain (CARDIA)    Difficulty of Paying Living Expenses: Not hard at all  Food Insecurity: No Food Insecurity (09/24/2022)   Hunger Vital Sign    Worried About Running Out of Food in the Last Year: Never true    Ran Out of Food in the Last Year: Never true  Transportation Needs: No Transportation Needs (09/24/2022)   PRAPARE - Administrator, Civil Service (Medical): No    Lack of Transportation (Non-Medical): No  Physical Activity: Insufficiently Active (09/24/2022)  Exercise Vital Sign    Days of Exercise per Week: 3 days    Minutes of Exercise per Session: 10 min  Stress: No Stress Concern Present (09/24/2022)   [employer] of Occupational Health - Occupational Stress Questionnaire    Feeling of Stress : Only a little  Social Connections: Unknown (09/24/2022)   Social Connection and Isolation Panel [NHANES]    Frequency of Communication with Friends and Family: More than three times a week    Frequency of Social Gatherings with Friends and Family: Twice a week    Attends Religious Services: Patient declined     Database administrator or Organizations: No    Attends Engineer, structural: Never    Marital Status: Divorced  Recent Concern: Social Connections - Moderately Isolated (07/31/2022)   Social Connection and Isolation Panel [NHANES]    Frequency of Communication with Friends and Family: More than three times a week    Frequency of Social Gatherings with Friends and Family: More than three times a week    Attends Religious Services: 1 to 4 times per year    Active Member of [employer] or Organizations: No    Attends Banker Meetings: Never    Marital Status: Divorced    No Known Allergies  Outpatient Medications Prior to Visit  Medication Sig Dispense Refill   amiodarone (PACERONE) 200 MG tablet Take 200 mg by mouth daily.     apixaban (ELIQUIS) 5 MG TABS tablet Take 5 mg by mouth daily.     atorvastatin (LIPITOR) 40 MG tablet Take 1 tablet (40 mg total) by mouth daily. 90 tablet 1   clopidogrel (PLAVIX) 75 MG tablet Take 75 mg by mouth daily.     FARXIGA 10 MG TABS tablet Take 10 mg by mouth daily.     metoprolol succinate (TOPROL-XL) 25 MG 24 hr tablet Take 25 mg by mouth daily.     naproxen sodium (ALEVE) 220 MG tablet Take 220 mg by mouth daily as needed.     pantoprazole (PROTONIX) 40 MG tablet Take 40 mg by mouth every morning.     spironolactone (ALDACTONE) 25 MG tablet Take 25 mg by mouth daily.     mupirocin ointment (BACTROBAN) 2 % Apply to small boils as needed. (Patient not taking: Reported on 09/25/2022) 22 g 1   No facility-administered medications prior to visit.     ROS Review of Systems  Constitutional:  Negative for activity change and appetite change.  HENT:  Negative for sinus pressure and sore throat.   Respiratory:  Negative for chest tightness, shortness of breath and wheezing.   Cardiovascular:  Negative for chest pain and palpitations.  Gastrointestinal:  Positive for constipation. Negative for abdominal distention and abdominal pain.   Genitourinary: Negative.   Musculoskeletal:        Knee pain  Psychiatric/Behavioral:  Negative for behavioral problems and dysphoric mood.     Objective:  BP (!) 154/85   Pulse 83   Temp 98.9 F (37.2 C) (Oral)   Ht 5\' 3"  (1.6 m)   Wt 207 lb 12.8 oz (94.3 kg)   SpO2 99%   BMI 36.81 kg/m      09/25/2022   10:50 AM 07/31/2022    3:04 PM 07/03/2022    9:49 AM  BP/Weight  Systolic BP 154 -- 144  Diastolic BP 85 -- 82  Wt. (Lbs) 207.8 201 201.8  BMI 36.81 kg/m2 35.05 kg/m2 35.19 kg/m2  Physical Exam Constitutional:      Appearance: She is well-developed.  Cardiovascular:     Rate and Rhythm: Normal rate.     Heart sounds: Normal heart sounds. No murmur heard. Pulmonary:     Effort: Pulmonary effort is normal.     Breath sounds: Normal breath sounds. No wheezing or rales.  Chest:     Chest wall: No tenderness.  Abdominal:     General: Bowel sounds are normal. There is no distension.     Palpations: Abdomen is soft. There is no mass.     Tenderness: There is no abdominal tenderness.  Musculoskeletal:     Right lower leg: No edema.     Left lower leg: No edema.     Comments: Crepitus in right knee on range of motion with associated tenderness in bilateral knees  Neurological:     Mental Status: She is alert and oriented to person, place, and time.  Psychiatric:        Mood and Affect: Mood normal.        Latest Ref Rng & Units 06/04/2022    4:03 PM 10/27/2020   11:17 AM 07/28/2019   12:00 PM  CMP  Glucose 70 - 99 mg/dL 90  88  86   BUN 8 - 27 mg/dL 18  14  13    Creatinine 0.57 - 1.00 mg/dL 3.24  4.01  0.27   Sodium 134 - 144 mmol/L 141  141  144   Potassium 3.5 - 5.2 mmol/L 4.3  4.4  3.8   Chloride 96 - 106 mmol/L 104  104  107   CO2 20 - 29 mmol/L 22  21  20    Calcium 8.7 - 10.3 mg/dL 9.7  9.8  9.2   Total Protein 6.0 - 8.5 g/dL 7.4  7.0  7.0   Total Bilirubin 0.0 - 1.2 mg/dL 0.5  0.4  0.5   Alkaline Phos 44 - 121 IU/L 70  83  75   AST 0 - 40 IU/L 27   22  21    ALT 0 - 32 IU/L 14  10  7      Lipid Panel     Component Value Date/Time   CHOL 243 (H) 06/04/2022 1603   TRIG 77 06/04/2022 1603   HDL 70 06/04/2022 1603   CHOLHDL 3.5 10/27/2020 1117   CHOLHDL 4.0 04/07/2015 1611   VLDL 20 04/07/2015 1611   LDLCALC 160 (H) 06/04/2022 1603    CBC    Component Value Date/Time   WBC 4.3 06/04/2022 1603   WBC 5.8 04/07/2015 1611   RBC 5.14 06/04/2022 1603   RBC 4.68 04/07/2015 1611   HGB 14.9 06/04/2022 1603   HCT 45.6 06/04/2022 1603   PLT 204 06/04/2022 1603   MCV 89 06/04/2022 1603   MCH 29.0 06/04/2022 1603   MCH 29.1 04/07/2015 1611   MCHC 32.7 06/04/2022 1603   MCHC 33.4 04/07/2015 1611   RDW 17.8 (H) 06/04/2022 1603   LYMPHSABS 1.1 06/04/2022 1603   MONOABS 0.3 04/07/2015 1611   EOSABS 0.1 06/04/2022 1603   BASOSABS 0.1 06/04/2022 1603    Lab Results  Component Value Date   HGBA1C 5.3 08/31/2016    Assessment & Plan:  Hypertensive heart disease: EF of 50 to 55% which has improved from 25% previously  -Euvolemic -Elevated blood pressure due to medication nonadherence -Patient has stopped all medications and wishes to pursue holistic treatment. Discussed potential risks of stopping medications, including worsening heart function. -  No change in plan as patient has declined to resume medications. -Continue to follow-up with cardiology  Paroxysmal A-fib: She has discontinued her anticoagulation and rate control medication -Discussed implication of action including risk for stroke but she is not in agreement with restarting her medication     CAD: status post DES to LAD, mid LAD/mid LAD and  mid LCx -She has discontinued Plavix and her statin -Unfortunately she is at a higher risk of restenosis due to medication nonadherence -Risk factor modification is important  Medication nonadherence/medication side effects: Patient reports constipation, frequent urination, dry mouth, and red eyes, which she attributes to her  medications. She has stopped all medications due to these side effects. -Order comprehensive metabolic panel, lipid panel, and B12 level to assess for any abnormalities that may be contributing to her symptoms.  Constipation: -Recommended trial of Miralax for constipation. -Continue with increased fiber intake  Urinary frequency: -Check urine for infection given report of urinary frequency. -UA with 1+ leukocytes.  Diuretic could have contributed to urinary frequency -Screen for diabetes given symptoms of dry mouth and frequent urination.   Right knee osteoarthritis: Patient reports knee pain, worse on the right. -Handicap form completed per request -Continue supportive care and monitor for progression.  General Health Maintenance: -Consider referral to a nutritionist if patient continues to struggle with diet and constipation. -Complete handicap form as requested by the patient.    -She is requesting multiple labs including screening for vitamin B12 deficiency.       No orders of the defined types were placed in this encounter.   Follow-up: Return in about 6 months (around 03/28/2023) for Chronic medical conditions.       Hoy Register, MD, FAAFP. [facility] [place], [place] [phone]   09/25/2022, 11:54 AM

## 2022-09-26 LAB — CMP14+EGFR
ALT: 18 IU/L (ref 0–32)
AST: 26 IU/L (ref 0–40)
Albumin: 4.2 g/dL (ref 3.8–4.8)
Alkaline Phosphatase: 73 IU/L (ref 44–121)
BUN/Creatinine Ratio: 14 (ref 12–28)
BUN: 13 mg/dL (ref 8–27)
Bilirubin Total: 0.5 mg/dL (ref 0.0–1.2)
CO2: 22 mmol/L (ref 20–29)
Calcium: 9.5 mg/dL (ref 8.7–10.3)
Chloride: 106 mmol/L (ref 96–106)
Creatinine, Ser: 0.9 mg/dL (ref 0.57–1.00)
Globulin, Total: 2.9 g/dL (ref 1.5–4.5)
Glucose: 82 mg/dL (ref 70–99)
Potassium: 4.5 mmol/L (ref 3.5–5.2)
Sodium: 144 mmol/L (ref 134–144)
Total Protein: 7.1 g/dL (ref 6.0–8.5)
eGFR: 66 mL/min/{1.73_m2} (ref >59)

## 2022-09-26 LAB — CBC WITH DIFFERENTIAL/PLATELET
Basophils Absolute: 0 10*3/uL (ref 0.0–0.2)
Basos: 1 %
EOS (ABSOLUTE): 0 10*3/uL (ref 0.0–0.4)
Eos: 1 %
Hematocrit: 41 % (ref 34.0–46.6)
Hemoglobin: 13.8 g/dL (ref 11.1–15.9)
Immature Grans (Abs): 0 10*3/uL (ref 0.0–0.1)
Immature Granulocytes: 1 %
Lymphocytes Absolute: 0.8 10*3/uL (ref 0.7–3.1)
Lymphs: 23 %
MCH: 31.2 pg (ref 26.6–33.0)
MCHC: 33.7 g/dL (ref 31.5–35.7)
MCV: 93 fL (ref 79–97)
Monocytes Absolute: 0.3 10*3/uL (ref 0.1–0.9)
Monocytes: 8 %
Neutrophils Absolute: 2.3 10*3/uL (ref 1.4–7.0)
Neutrophils: 66 %
Platelets: 172 10*3/uL (ref 150–450)
RBC: 4.42 x10E6/uL (ref 3.77–5.28)
RDW: 13.1 % (ref 11.7–15.4)
WBC: 3.5 10*3/uL (ref 3.4–10.8)

## 2022-09-26 LAB — HEMOGLOBIN A1C
Est. average glucose Bld gHb Est-mCnc: 97 mg/dL
Hgb A1c MFr Bld: 5 % (ref 4.8–5.6)

## 2022-09-26 LAB — LP+NON-HDL CHOLESTEROL
Cholesterol, Total: 252 mg/dL — ABNORMAL HIGH (ref 100–199)
HDL: 71 mg/dL (ref >39)
LDL Chol Calc (NIH): 168 mg/dL — ABNORMAL HIGH (ref 0–99)
Total Non-HDL-Chol (LDL+VLDL): 181 mg/dL — ABNORMAL HIGH (ref 0–129)
Triglycerides: 78 mg/dL (ref 0–149)
VLDL Cholesterol Cal: 13 mg/dL (ref 5–40)

## 2022-09-26 LAB — VITAMIN B12: Vitamin B-12: 422 pg/mL (ref 232–1245)

## 2022-09-27 LAB — URINE CULTURE

## 2022-10-10 ENCOUNTER — Telehealth: Payer: Self-pay

## 2022-10-10 NOTE — Telephone Encounter (Signed)
Copied from CRM [id number]. Topic: Referral - Request for Referral >> Oct 10, 2022 10:45 AM Everette C wrote: Has patient seen PCP for this complaint? Yes.   *If NO, is insurance requiring patient see PCP for this issue before PCP can refer them? Referral for which specialty: Chiropractor  Preferred provider/office: Patient would like for them to accept [facility]  Reason for referral: back and hip discomfort

## 2022-10-29 NOTE — Telephone Encounter (Signed)
Pt was called and scheduled a virtual appointment with PCP to discuss

## 2022-10-30 ENCOUNTER — Telehealth: Payer: 59 | Admitting: Family Medicine

## 2022-11-01 ENCOUNTER — Ambulatory Visit: Payer: 59 | Attending: Family Medicine | Admitting: Family Medicine

## 2022-11-01 ENCOUNTER — Encounter: Payer: Self-pay | Admitting: Family Medicine

## 2022-11-01 DIAGNOSIS — M5442 Lumbago with sciatica, left side: Secondary | ICD-10-CM | POA: Diagnosis not present

## 2022-11-01 DIAGNOSIS — G8929 Other chronic pain: Secondary | ICD-10-CM

## 2022-11-01 MED ORDER — METHOCARBAMOL 750 MG PO TABS: 750.0000 mg | ORAL_TABLET | Freq: Three times a day (TID) | ORAL | 1 refills | Status: AC | PRN

## 2022-11-01 MED ORDER — DULOXETINE HCL 60 MG PO CPEP: 60.0000 mg | ORAL_CAPSULE | Freq: Every day | ORAL | 1 refills | Status: DC

## 2022-11-01 NOTE — Progress Notes (Signed)
Virtual Visit via Telephone Note  I connected with Tara Hernandez, on 11/01/2022 at 8:18 AM by telephone and verified that I am speaking with the correct person using two identifiers.   Consent: I discussed the limitations, risks, security and privacy concerns of performing an evaluation and management service by telephone and the availability of in person appointments. I also discussed with the patient that there may be a patient responsible charge related to this service. The patient expressed understanding and agreed to proceed.   Location of Patient: Home  Location of Provider: Home office   Persons participating in Telemedicine visit: Melida Gimenez Dr. Alvis Lemmings     History of Present Illness: Tara Hernandez is a 77 y.o. year old female with a history of hypertension, hyperlipidemia, PE (in 03/2022), CAD (status post DES to LAD, mid LAD/mid LAD and  mid LCx), ischemic cardiomyopathy (EF 50 to 55% from 07/2022 improved from previous echo with a EF of 25% with severe right heart failure, A-fib.    Discussed the use of AI scribe software for clinical note transcription with the patient, who gave verbal consent to proceed.   The patient presents with worsening back pain that radiates down the left leg, sometimes reaching the foot. The pain has been ongoing for some time, but has recently intensified, reaching a severity of 10/10. The pain was particularly severe the previous day, inhibiting the patient's ability to walk upright. The patient has a history of a degenerative disc, which she suspects may be worsening. Over-the-counter Aleve provides little relief, but massages and certain positions seem to alleviate the pain somewhat. The patient suspects the pain may be related to the sciatic nerve. She has not previously sought physical therapy for the pain due to cost, but is now considering chiropractic treatment as it is covered by their insurance. She has already made an appointment with a  chiropractor, but was informed she needs a referral for insurance to cover x-rays.      Past Medical History:  Diagnosis Date   Arthritis    Cataract    Gout    Regurgitation and rechewing    No Known Allergies  Current Outpatient Medications on File Prior to Visit  Medication Sig Dispense Refill   amiodarone (PACERONE) 200 MG tablet Take 200 mg by mouth daily.     atorvastatin (LIPITOR) 40 MG tablet Take 1 tablet (40 mg total) by mouth daily. 90 tablet 1   clopidogrel (PLAVIX) 75 MG tablet Take 75 mg by mouth daily.     FARXIGA 10 MG TABS tablet Take 10 mg by mouth daily.     metoprolol succinate (TOPROL-XL) 25 MG 24 hr tablet Take 25 mg by mouth daily.     mupirocin ointment (BACTROBAN) 2 % Apply to small boils as needed. (Patient not taking: Reported on 09/25/2022) 22 g 1   naproxen sodium (ALEVE) 220 MG tablet Take 220 mg by mouth daily as needed.     pantoprazole (PROTONIX) 40 MG tablet Take 40 mg by mouth every morning.     spironolactone (ALDACTONE) 25 MG tablet Take 25 mg by mouth daily.     No current facility-administered medications on file prior to visit.    ROS: See HPI  Observations/Objective: Awake, alert, oriented x3 Not in acute distress Normal mood      Latest Ref Rng & Units 09/25/2022   12:07 PM 06/04/2022    4:03 PM 10/27/2020   11:17 AM  CMP  Glucose 70 - 99 mg/dL  82  90  88   BUN 8 - 27 mg/dL 13  18  14    Creatinine 0.57 - 1.00 mg/dL 5.78  4.69  6.29   Sodium 134 - 144 mmol/L 144  141  141   Potassium 3.5 - 5.2 mmol/L 4.5  4.3  4.4   Chloride 96 - 106 mmol/L 106  104  104   CO2 20 - 29 mmol/L 22  22  21    Calcium 8.7 - 10.3 mg/dL 9.5  9.7  9.8   Total Protein 6.0 - 8.5 g/dL 7.1  7.4  7.0   Total Bilirubin 0.0 - 1.2 mg/dL 0.5  0.5  0.4   Alkaline Phos 44 - 121 IU/L 73  70  83   AST 0 - 40 IU/L 26  27  22    ALT 0 - 32 IU/L 18  14  10      Lipid Panel     Component Value Date/Time   CHOL 252 (H) 09/25/2022 1207   TRIG 78 09/25/2022 1207    HDL 71 09/25/2022 1207   CHOLHDL 3.5 10/27/2020 1117   CHOLHDL 4.0 04/07/2015 1611   VLDL 20 04/07/2015 1611   LDLCALC 168 (H) 09/25/2022 1207   LABVLDL 13 09/25/2022 1207    Lab Results  Component Value Date   HGBA1C 5.0 09/25/2022    Assessment and Plan:     Degenerative Disc Disease with Sciatica Severe back pain radiating down the left leg, worsening with age and activity. Previous diagnosis of degenerative disc disease. Aleve and massage provide minimal relief. -Refer to chiropractor for evaluation and treatment. -Prescribe Robaxin (muscle relaxant) for pain relief. -Prescribe Cymbalta for chronic pain management, with caution regarding potential side effects and patient's known sensitivity to medications. -Advise patient to report any adverse effects of medications.        Follow Up Instructions: Keep previously scheduled appt   I discussed the assessment and treatment plan with the patient. The patient was provided an opportunity to ask questions and all were answered. The patient agreed with the plan and demonstrated an understanding of the instructions.   The patient was advised to call back or seek an in-person evaluation if the symptoms worsen or if the condition fails to improve as anticipated.     I provided 13 minutes total of non-face-to-face time during this encounter.   Hoy Register, MD, FAAFP. [facility] [place], [place] [phone]   11/01/2022, 8:18 AM

## 2022-11-01 NOTE — Patient Instructions (Signed)

## 2022-11-15 ENCOUNTER — Other Ambulatory Visit: Payer: Self-pay | Admitting: Family Medicine

## 2022-12-13 ENCOUNTER — Other Ambulatory Visit: Payer: Self-pay | Admitting: Family Medicine

## 2022-12-13 NOTE — Telephone Encounter (Signed)
Requested medication (s) are due for refill today: Yes  Requested medication (s) are on the active medication list: Yes  Last refill:  11/01/22  Future visit scheduled: No  Notes to clinic:  Pharmacy request 90 day supply..    Requested Prescriptions  Pending Prescriptions Disp Refills   DULoxetine (CYMBALTA) 60 MG capsule [Pharmacy Med Name: DULOXETINE HCL DR 60 MG CAP] 90 capsule 1    Sig: TAKE 1 CAPSULE BY MOUTH DAILY FOR BACK PAIN     Psychiatry: Antidepressants - SNRI - duloxetine Failed - 12/13/2022  9:31 AM      Failed - Last BP in normal range    BP Readings from Last 1 Encounters:  09/25/22 (!) 155/83         Passed - Cr in normal range and within 360 days    Creat  Date Value Ref Range Status  04/07/2015 0.75 0.50 - 0.99 mg/dL Final   Creatinine, Ser  Date Value Ref Range Status  09/25/2022 0.90 0.57 - 1.00 mg/dL Final         Passed - eGFR is 30 or above and within 360 days    GFR, Est African American  Date Value Ref Range Status  04/07/2015 >89 >=60 mL/min Final   GFR calc Af Amer  Date Value Ref Range Status  07/28/2019 80 >59 mL/min/1.73 Final    Comment:    **[facility] currently reports eGFR in compliance with the current**   recommendations of the [employer]. [facility] will   update reporting as new guidelines are published from the NKF-ASN   Task force.    GFR, Est Non African American  Date Value Ref Range Status  04/07/2015 82 >=60 mL/min Final    Comment:      The estimated GFR is a calculation valid for adults (>=20 years old) that uses the CKD-EPI algorithm to adjust for age and sex. It is   not to be used for children, pregnant women, hospitalized patients,    patients on dialysis, or with rapidly changing kidney function. According to the NKDEP, eGFR >89 is normal, 60-89 shows mild impairment, 30-59 shows moderate impairment, 15-29 shows severe impairment and <15 is ESRD.      GFR calc non Af Amer  Date Value Ref  Range Status  07/28/2019 70 >59 mL/min/1.73 Final   eGFR  Date Value Ref Range Status  09/25/2022 66 >59 mL/min/1.73 Final         Passed - Completed PHQ-2 or PHQ-9 in the last 360 days      Passed - Valid encounter within last 6 months    Recent Outpatient Visits           1 month ago Chronic left-sided low back pain with left-sided sciatica   [place] [facility], Odette Horns, MD   2 months ago Hypertensive heart disease with chronic combined systolic and diastolic congestive heart failure [facility])   [place] [facility] Hoy Register, MD   6 months ago Mixed hyperlipidemia   [place] [facility] Hoy Register, MD   2 years ago Encounter for [employer] annual wellness exam   [facility] Marcine Matar, MD   3 years ago Annual physical exam   [place] [facility] Marcine Matar, MD       Future Appointments  In 3 weeks [place], Alben Spittle, MD [facility]   In 3 months Hoy Register, MD [facility]

## 2023-01-04 ENCOUNTER — Ambulatory Visit: Payer: 59 | Admitting: Internal Medicine

## 2023-03-28 ENCOUNTER — Ambulatory Visit: Payer: 59 | Admitting: Family Medicine

## 2024-03-10 ENCOUNTER — Ambulatory Visit

## 2024-03-10 ENCOUNTER — Encounter: Payer: Self-pay | Admitting: Family Medicine

## 2024-06-02 ENCOUNTER — Ambulatory Visit

## 2024-06-02 ENCOUNTER — Ambulatory Visit: Admitting: Family Medicine
